# Patient Record
Sex: Female | Born: 1985
Health system: Southern US, Community
[De-identification: ages and names within clinical notes are randomized; demographics above are authoritative.]

## PROBLEM LIST (undated history)

## (undated) DIAGNOSIS — M419 Scoliosis, unspecified: Secondary | ICD-10-CM

## (undated) DIAGNOSIS — A630 Anogenital (venereal) warts: Secondary | ICD-10-CM

## (undated) DIAGNOSIS — G43009 Migraine without aura, not intractable, without status migrainosus: Secondary | ICD-10-CM

## (undated) DIAGNOSIS — F411 Generalized anxiety disorder: Secondary | ICD-10-CM

## (undated) DIAGNOSIS — Z9109 Other allergy status, other than to drugs and biological substances: Secondary | ICD-10-CM

## (undated) DIAGNOSIS — G43909 Migraine, unspecified, not intractable, without status migrainosus: Secondary | ICD-10-CM

## (undated) HISTORY — DX: Migraine without aura, not intractable, without status migrainosus: G43.009

## (undated) HISTORY — DX: Other allergy status, other than to drugs and biological substances: Z91.09

## (undated) HISTORY — DX: Anogenital (venereal) warts: A63.0

## (undated) HISTORY — DX: Migraine, unspecified, not intractable, without status migrainosus: G43.909

## (undated) HISTORY — DX: Scoliosis, unspecified: M41.9

## (undated) HISTORY — DX: Generalized anxiety disorder: F41.1

---

## 2007-01-15 HISTORY — PX: CONDYLOMA EXCISION/FULGURATION: SHX1389

## 2012-09-03 ENCOUNTER — Emergency Department (INDEPENDENT_AMBULATORY_CARE_PROVIDER_SITE_OTHER)
Admission: EM | Admit: 2012-09-03 | Discharge: 2012-09-03 | Disposition: A | Discharge status: Home / Self Care | Payer: 59 | Source: Home / Self Care | Attending: Emergency Medicine | Admitting: Emergency Medicine

## 2012-09-03 ENCOUNTER — Encounter: Payer: Self-pay | Admitting: Emergency Medicine

## 2012-09-03 DIAGNOSIS — M25532 Pain in left wrist: Secondary | ICD-10-CM

## 2012-09-03 DIAGNOSIS — M25539 Pain in unspecified wrist: Secondary | ICD-10-CM

## 2012-09-03 NOTE — ED Notes (Signed)
Left wrist pain x 4 days

## 2012-09-03 NOTE — ED Provider Notes (Signed)
  CSN: [id number]     Arrival date & time 09/03/12  0813 History     First MD Initiated Contact with Patient 09/03/12 [phone]     Chief Complaint  Patient presents with  . Wrist Pain   (Consider location/radiation/quality/duration/timing/severity/associated sxs/prior Treatment) HPI Patient with left wrist pain for last 4 days.  She states she had similar pain last year and was told that it was tendinitis and it went away on its own.  She also reports intermittent pain for the last 3-5 years.  It is located mostly on the dorsal aspect of her left wrist and can radiate up into the arm.  No known injury.  She cannot specify one located that it hurts more than others.  Not using any medications or modalities..  History reviewed. No pertinent past medical history. No past surgical history on file. No family history on file. History  Substance Use Topics  . Smoking status: Not on file  . Smokeless tobacco: Not on file  . Alcohol Use: Not on file   OB History   Grav Para Term Preterm Abortions TAB SAB Ect Mult Living                 Review of Systems  All other systems reviewed and are negative.    Allergies  Review of patient's allergies indicates no known allergies.  Home Medications  No current outpatient prescriptions on file. BP 122/77  Pulse 92  Temp(Src) 98.5 F (36.9 C) (Oral)  Ht 5\' 5"  (1.651 m)  Wt 159 lb (72.122 kg)  BMI 26.46 kg/m2  SpO2 100% Physical Exam  Nursing note and vitals reviewed. Constitutional: She is oriented to person, place, and time. She appears well-developed and well-nourished.  HENT:  Head: Normocephalic and atraumatic.  Eyes: No scleral icterus.  Neck: Neck supple.  Cardiovascular: Regular rhythm and normal heart sounds.   Pulmonary/Chest: Effort normal and breath sounds normal. No respiratory distress.  Musculoskeletal:  Examinations demonstrated full range of motion.  She has global tenderness on the dorsal aspect.  She does have some  swelling up on volar flexion of the wrist and tenderness at that location especially around the scapholunate area.  Phalen's test demonstrates pain to location and no radiculopathy.  Distal neurovascular status is intact.  Neurological: She is alert and oriented to person, place, and time.  Skin: Skin is warm and dry.  Psychiatric: She has a normal mood and affect. Her speech is normal.    ED Course   Procedures (including critical care time)  Labs Reviewed - No data to display No results found. 1. Wrist pain, left     MDM   Patient with left wrist pain.  I suspect that this may be a ganglion cyst due to the long-standing symptoms in the swelling.  I've given her a wrist splint and advised her followup in sports medicine physician next door who can then do an ultrasound of the area and perhaps either an aspiration or injection.  If not improving she may need an MRI or surgical consult at that time.  Patient understands and agrees.  Can take over-the-counter anti-inflammatories as needed    Marlaine Hind, MD 09/03/12 1000

## 2012-09-09 ENCOUNTER — Ambulatory Visit (INDEPENDENT_AMBULATORY_CARE_PROVIDER_SITE_OTHER): Payer: 59 | Admitting: Sports Medicine

## 2012-09-09 ENCOUNTER — Encounter: Payer: Self-pay | Admitting: Sports Medicine

## 2012-09-09 VITALS — BP 121/73 | HR 98 | Wt 160.0 lb

## 2012-09-09 DIAGNOSIS — M25532 Pain in left wrist: Secondary | ICD-10-CM | POA: Insufficient documentation

## 2012-09-09 DIAGNOSIS — M6740 Ganglion, unspecified site: Secondary | ICD-10-CM

## 2012-09-09 DIAGNOSIS — F329 Major depressive disorder, single episode, unspecified: Secondary | ICD-10-CM

## 2012-09-09 MED ORDER — MELOXICAM 15 MG PO TABS: ORAL_TABLET | ORAL | Status: DC

## 2012-09-09 MED ORDER — CITALOPRAM HYDROBROMIDE 20 MG PO TABS: 20.0000 mg | ORAL_TABLET | Freq: Every day | ORAL | Status: DC

## 2012-09-09 NOTE — Assessment & Plan Note (Signed)
Injection of ganglion cyst at radiocarpal joint as above. Wrist brace given. Return in one month for this. Mobic for pain.

## 2012-09-09 NOTE — Assessment & Plan Note (Signed)
Tearful in the exam room. Also extremely anxious. Starting citalopram, she'll come back to see me in 2-3 weeks.

## 2012-09-09 NOTE — Progress Notes (Signed)
   Subjective:    I'm seeing this patient as a consultation for:  Dr. Orson Aloe  CC: Left wrist pain  HPI: Left wrist pain: This is been present for years, she notes a visible and palpable mass over the dorsal radial carpal joint, worse with increased use of the left hand. Pain is localized, doesn't radiate, moderate. She was seen in urgent care, was diagnosed with ganglion cyst, and referred to me for further evaluation and definitive treatment.  Anxiety/depression: During the entire visit Brittney Estrada was tearful, she is extremely worried, and unfounded nature about her wrist pain, she also endorses depressed mood, poor concentration, poor interest, feelings of guilt, poor appetite, poor sleep, but denies any suicidal or homicidal ideation. She has not been on SSRI in the past, she doesn't get worse that she was on valproic acid in the distant past with inadequate response. She has no primary care provider.  Past medical history, Surgical history, Family history not pertinant except as noted below, Social history, Allergies, and medications have been entered into the medical record, reviewed, and no changes needed.   Review of Systems: No headache, visual changes, nausea, vomiting, diarrhea, constipation, dizziness, abdominal pain, skin rash, fevers, chills, night sweats, weight loss, swollen lymph nodes, body aches, joint swelling, muscle aches, chest pain, shortness of breath, mood changes, visual or auditory hallucinations.   Objective:   General: Well Developed, well nourished, tearful. Neuro/Psych: Alert and oriented x3, extra-ocular muscles intact, able to move all 4 extremities, sensation grossly intact. Skin: Warm and dry, no rashes noted.  Respiratory: Not using accessory muscles, speaking in full sentences, trachea midline.  Cardiovascular: Pulses palpable, no extremity edema. Abdomen: Does not appear distended. Left Wrist: There is a visible and palpable fullness over the dorsal  radiocarpal joint suggestive of a ganglionic cyst. ROM smooth and normal with good flexion and extension and ulnar/radial deviation that is symmetrical with opposite wrist. Palpation is normal over metacarpals, navicular, lunate, and TFCC; tendons without tenderness/ swelling No snuffbox tenderness. No tenderness over Canal of Guyon. Strength 5/5 in all directions without pain. Negative Finkelstein, tinel's and phalens. Negative Watson's test.  Procedure: Real-time Ultrasound Guided Injection of left dorsal radiocarpal joint and ganglionic cyst Device: GE Logiq E  Verbal informed consent obtained.  Time-out conducted.  Noted no overlying erythema, induration, or other signs of local infection.  Skin prepped in a sterile fashion.  Local anesthesia: Topical Ethyl chloride.  With sterile technique and under real time ultrasound guidance:  25-gauge needle advanced into the cyst, this was located just superficial to the radio lunate joint, 0.5 cc Kenalog 40, 1 cc lidocaine injected easily. Completed without difficulty  Pain immediately resolved suggesting accurate placement of the medication.  Advised to call if fevers/chills, erythema, induration, drainage, or persistent bleeding.  Images permanently stored and available for review in the ultrasound unit.  Impression: Technically successful ultrasound guided injection.  Impression and Recommendations:   This case required medical decision making of moderate complexity.

## 2012-09-16 ENCOUNTER — Encounter: Payer: Self-pay | Admitting: Sports Medicine

## 2012-09-30 ENCOUNTER — Encounter: Payer: Self-pay | Admitting: Sports Medicine

## 2012-09-30 ENCOUNTER — Ambulatory Visit (INDEPENDENT_AMBULATORY_CARE_PROVIDER_SITE_OTHER): Payer: 59

## 2012-09-30 ENCOUNTER — Ambulatory Visit: Payer: 59 | Admitting: Family Medicine

## 2012-09-30 ENCOUNTER — Ambulatory Visit (INDEPENDENT_AMBULATORY_CARE_PROVIDER_SITE_OTHER): Payer: 59 | Admitting: Sports Medicine

## 2012-09-30 VITALS — BP 111/77 | HR 77 | Wt 157.0 lb

## 2012-09-30 DIAGNOSIS — M25532 Pain in left wrist: Secondary | ICD-10-CM

## 2012-09-30 DIAGNOSIS — M25539 Pain in unspecified wrist: Secondary | ICD-10-CM

## 2012-09-30 DIAGNOSIS — M542 Cervicalgia: Secondary | ICD-10-CM

## 2012-09-30 DIAGNOSIS — F329 Major depressive disorder, single episode, unspecified: Secondary | ICD-10-CM

## 2012-09-30 DIAGNOSIS — M7918 Myalgia, other site: Secondary | ICD-10-CM | POA: Insufficient documentation

## 2012-09-30 NOTE — Assessment & Plan Note (Signed)
Stop citalopram, she did feel significantly better when switching from third to second shift. We will keep an eye on this. She does have some myofascial symptoms, we could certainly consider amitriptyline, gabapentin, Lyrica, or Cymbalta in the future.
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
## 2012-09-30 NOTE — Assessment & Plan Note (Signed)
Resolved after injection of ganglion cyst under ultrasound guidance.

## 2012-11-10 ENCOUNTER — Ambulatory Visit (INDEPENDENT_AMBULATORY_CARE_PROVIDER_SITE_OTHER): Payer: 59 | Admitting: Sports Medicine

## 2012-11-10 ENCOUNTER — Encounter: Payer: Self-pay | Admitting: Sports Medicine

## 2012-11-10 VITALS — BP 127/80 | HR 89 | Wt 160.0 lb

## 2012-11-10 DIAGNOSIS — M25539 Pain in unspecified wrist: Secondary | ICD-10-CM

## 2012-11-10 DIAGNOSIS — M25532 Pain in left wrist: Secondary | ICD-10-CM

## 2012-11-10 DIAGNOSIS — Z23 Encounter for immunization: Secondary | ICD-10-CM

## 2012-11-10 DIAGNOSIS — Z299 Encounter for prophylactic measures, unspecified: Secondary | ICD-10-CM | POA: Insufficient documentation

## 2012-11-10 DIAGNOSIS — M7918 Myalgia, other site: Secondary | ICD-10-CM

## 2012-11-10 DIAGNOSIS — IMO0001 Reserved for inherently not codable concepts without codable children: Secondary | ICD-10-CM

## 2012-11-10 LAB — HEPATITIS B SURFACE ANTIBODY, QUANTITATIVE: Hep B S AB Quant (Post): 0 m[IU]/mL

## 2012-11-10 NOTE — Assessment & Plan Note (Signed)
Continues to be resolved after injection of ganglion cyst.

## 2012-11-10 NOTE — Assessment & Plan Note (Signed)
Certainly this is in the spectrum of fibromyalgia. She is hypersensitive multiple areas over her trapezius, neck, and back. We did discuss nerve blocking medication such as gabapentin, amitriptyline, Cymbalta. She would like to thank about this further before starting the medication, I will have her do some home exercises, and print her out some information. Cervical spine x-rays were negative.

## 2012-11-10 NOTE — Patient Instructions (Signed)
Myofascial Pain Syndrome Myofascial pain syndrome is a pain disorder. This pain may be felt in the muscles. It may come and go. Myofascial pain syndrome always has trigger or tender points in the muscle that will cause pain when pressed.  CAUSES Myofascial pain may be caused by injuries, especially auto accidents, or by overuse of certain muscles. Typically the pain is long lasting. It is made worse by overuse of the involved muscles, emotional distress, and by cold, damp weather. Myofascial pain syndrome often develops in patients whose response to stress is an increase in muscle tone, and is seen in greater frequency in patients with pre-existing tension headaches. SYMPTOMS  Myofascial pain syndrome causes a wide variety of symptoms. You may see tight ropy bands of muscle. Problems may also include aching, cramping, burning, numbness, tingling, and other uncomfortable sensations in muscular areas. It most commonly affects the neck, upper back, and shoulder areas. Pain often radiates into the arms and hands.  TREATMENT Treatment includes resting the affected muscular area and applying ice packs to reduce spasm and pain.  Certain medications that block excess conduction through nerves are extremely effective. Trigger point injection, is a valuable initial therapy as well. This therapy is an injection of local anesthetic directly into the trigger point. Trigger points are often present at the source of pain. Pain relief following injection confirms the diagnosis of myofascial pain syndrome. Fairly vigorous therapy can be carried out during the pain-free period after each injection. Stretching exercises to loosen up the muscles are also useful. Transcutaneous electrical nerve stimulation (TENS) may provide relief from pain. TENS is the use of electric current produced by a device to stimulate the nerves. Ultrasound therapy applied directly over the affected muscle may also provide pain relief. Anti-inflammatory  pain medicine can be helpful. Symptoms will gradually improve over a period of weeks to months with proper treatment. Symptoms however can also persist long-term. This condition is very manageable. HOME CARE INSTRUCTIONS Call your caregiver for follow-up care as recommended.  SEEK MEDICAL CARE IF:  Your pain is severe and not helped with medications. Document Released: 02/08/2004 Document Revised: 03/25/2011 Document Reviewed: 02/16/2010 [facility] Patient Information 2014 [place], [place].

## 2012-11-10 NOTE — Progress Notes (Signed)
  Subjective:    CC: Followup  HPI: Mood disorder: Resolved with switching to second shift.  Wrist pain: Completely resolved after ultrasound guided injection of a ganglion cyst, unfortunately she tells me that her insurance coverage did not begin until the next day, and wonders if we can switch date of service, I advised that this would not be possible. She can work with [employer] to set up a payment plan.  Neck pain: Present chronically, multiple painful locations, x-rays were negative in the recent past, and she is still not entirely certain that she wants me to treat it, but does desire to talk about it.  Past medical history, Surgical history, Family history not pertinant except as noted below, Social history, Allergies, and medications have been entered into the medical record, reviewed, and no changes needed.   Review of Systems: No fevers, chills, night sweats, weight loss, chest pain, or shortness of breath.   Objective:    General: Well Developed, well nourished, and in no acute distress.  Neuro: Alert and oriented x3, extra-ocular muscles intact, sensation grossly intact.  HEENT: Normocephalic, atraumatic, pupils equal round reactive to light, neck supple, no masses, no lymphadenopathy, thyroid nonpalpable.  Skin: Warm and dry, no rashes. Cardiac: Regular rate and rhythm, no murmurs rubs or gallops, no lower extremity edema.  Respiratory: Clear to auscultation bilaterally. Not using accessory muscles, speaking in full sentences. Neck: Inspection unremarkable. No palpable stepoffs. Negative Spurling's maneuver. Full neck range of motion Multiple areas of discrete tenderness to palpation, she is somewhat hyperesthetic with definite signs of symptom exaggeration. Grip strength and sensation normal in bilateral hands Strength good C4 to T1 distribution No sensory change to C4 to T1 Negative Hoffman sign bilaterally Reflexes normal  Cervical spine x-rays were reviewed and are  completely normal.  Impression and Recommendations:

## 2012-11-10 NOTE — Assessment & Plan Note (Signed)
Up-to-date on flu, cervical cancer screening. Tdap given today. Antibody titers. Needs hepatitis B

## 2012-11-30 ENCOUNTER — Ambulatory Visit: Payer: 59 | Admitting: Sports Medicine

## 2012-12-08 ENCOUNTER — Ambulatory Visit: Payer: 59 | Admitting: Sports Medicine

## 2014-04-19 ENCOUNTER — Emergency Department
Admission: EM | Admit: 2014-04-19 | Discharge: 2014-04-19 | Disposition: A | Discharge status: Home / Self Care | Payer: 59 | Source: Home / Self Care | Attending: Emergency Medicine | Admitting: Emergency Medicine

## 2014-04-19 ENCOUNTER — Encounter: Payer: Self-pay | Admitting: *Deleted

## 2014-04-19 DIAGNOSIS — J029 Acute pharyngitis, unspecified: Secondary | ICD-10-CM

## 2014-04-19 LAB — POCT RAPID STREP A (OFFICE): Rapid Strep A Screen: NEGATIVE

## 2014-04-19 MED ORDER — FLUCONAZOLE 150 MG PO TABS: 150.0000 mg | ORAL_TABLET | Freq: Every day | ORAL | Status: DC

## 2014-04-19 MED ORDER — AMOXICILLIN 500 MG PO CAPS: 500.0000 mg | ORAL_CAPSULE | Freq: Three times a day (TID) | ORAL | Status: DC

## 2014-04-19 NOTE — Discharge Instructions (Signed)

## 2014-04-19 NOTE — ED Notes (Signed)
Pt c/o sore throat x 1 wk, worse x 1 day. She reports recent strep exposure.

## 2014-04-19 NOTE — ED Provider Notes (Signed)
CSN: [id number]     Arrival date & time 04/19/14  1425 History   First MD Initiated Contact with Patient 04/19/14 1506     Chief Complaint  Patient presents with  . Sore Throat   (Consider location/radiation/quality/duration/timing/severity/associated sxs/prior Treatment) Patient is a 29 y.o. female presenting with pharyngitis. The history is provided by the patient. No language interpreter was used.  Sore Throat This is a new problem. The problem occurs constantly. The problem has been gradually worsening. Pertinent negatives include no headaches. Nothing aggravates the symptoms. Nothing relieves the symptoms. She has tried nothing for the symptoms. The treatment provided no relief.    Past Medical History  Diagnosis Date  . Condyloma   . Scoliosis    Past Surgical History  Procedure Laterality Date  . Condyloma excision/fulguration  2009   Family History  Problem Relation Age of Onset  . Alcohol abuse Mother   . Cancer Mother   . Depression Mother   . Hypertension Mother   . Cancer Maternal Aunt   . Diabetes Maternal Grandmother    History  Substance Use Topics  . Smoking status: Light Tobacco Smoker    Types: Cigarettes  . Smokeless tobacco: Not on file  . Alcohol Use: Yes   OB History    No data available     Review of Systems  HENT: Positive for sore throat.   Neurological: Negative for headaches.  All other systems reviewed and are negative.   Allergies  Latex and Other  Home Medications   Prior to Admission medications   Medication Sig Start Date End Date Taking? Authorizing Provider  amoxicillin (AMOXIL) 500 MG capsule Take 1 capsule (500 mg total) by mouth 3 (three) times daily. 04/19/14   Elson AreasLeslie K Gianne Shugars, PA-C  fluconazole (DIFLUCAN) 150 MG tablet Take 1 tablet (150 mg total) by mouth daily. 04/19/14   Elson AreasLeslie K Dilraj Killgore, PA-C  meloxicam (MOBIC) 15 MG tablet One tab PO qAM with breakfast for 2 weeks, then daily prn pain. 09/09/12   Monica Bectonhomas J Thekkekandam, MD    BP 120/78 mmHg  Pulse 76  Temp(Src) 98.7 F (37.1 C) (Oral)  Resp 16  Ht 5\' 5"  (1.651 m)  Wt 158 lb (71.668 kg)  BMI 26.29 kg/m2  SpO2 99%  LMP 04/03/2014 Physical Exam  Constitutional: She is oriented to person, place, and time. She appears well-developed and well-nourished.  HENT:  Head: Normocephalic.  Right Ear: External ear normal.  Left Ear: External ear normal.  Nose: Nose normal.  Eyes: Conjunctivae and EOM are normal. Pupils are equal, round, and reactive to light.  Neck: Normal range of motion.  Cardiovascular: Normal rate and normal heart sounds.   Pulmonary/Chest: Effort normal and breath sounds normal.  Abdominal: Soft. She exhibits no distension.  Musculoskeletal: Normal range of motion.  Neurological: She is alert and oriented to person, place, and time.  Skin: Skin is warm.  Psychiatric: She has a normal mood and affect.  Nursing note and vitals reviewed.   ED Course  Procedures (including critical care time) Labs Review Labs Reviewed  POCT RAPID STREP A (OFFICE)   Strep is negative Pt exposed to strep,  I will treat and culture Imaging Review No results found.   MDM   1. Acute pharyngitis, unspecified pharyngitis type    Amoxicilian Diflucan See Dr. Karie Schwalbet for recheck if symptoms persist    Elson AreasLeslie K Tamya Denardo, [place] JerseyPA-C 04/19/14 1557

## 2014-04-24 ENCOUNTER — Telehealth: Payer: Self-pay | Admitting: Emergency Medicine

## 2015-05-05 ENCOUNTER — Encounter: Payer: Self-pay | Admitting: Sports Medicine

## 2015-05-05 ENCOUNTER — Ambulatory Visit (INDEPENDENT_AMBULATORY_CARE_PROVIDER_SITE_OTHER): Payer: Managed Care, Other (non HMO) | Admitting: Sports Medicine

## 2015-05-05 VITALS — BP 133/89 | HR 87 | Resp 18 | Wt 166.2 lb

## 2015-05-05 DIAGNOSIS — Z91018 Allergy to other foods: Secondary | ICD-10-CM

## 2015-05-05 DIAGNOSIS — G43909 Migraine, unspecified, not intractable, without status migrainosus: Secondary | ICD-10-CM

## 2015-05-05 DIAGNOSIS — F411 Generalized anxiety disorder: Secondary | ICD-10-CM

## 2015-05-05 DIAGNOSIS — G43009 Migraine without aura, not intractable, without status migrainosus: Secondary | ICD-10-CM

## 2015-05-05 HISTORY — DX: Migraine, unspecified, not intractable, without status migrainosus: G43.909

## 2015-05-05 HISTORY — DX: Generalized anxiety disorder: F41.1

## 2015-05-05 MED ORDER — RIZATRIPTAN BENZOATE 10 MG PO TBDP: 10.0000 mg | ORAL_TABLET | ORAL | Status: DC | PRN

## 2015-05-05 MED ORDER — TOPIRAMATE 50 MG PO TABS: ORAL_TABLET | ORAL | Status: DC

## 2015-05-05 NOTE — Assessment & Plan Note (Signed)
Currently doing counseling which is ineffective, tearful in the exam room. However resistant to any form of pharmacologic treatment. She is currently precontemplative, no suicidal or homicidal ideation,  We will approach this at a future date.

## 2015-05-05 NOTE — Progress Notes (Signed)
  Subjective:    CC: headaches, anxiety  HPI: This is a pleasant 30 year old female, all of her life she's had migraine headaches but is never been a preventative agent and is never taken an abortive agent. Unfortunately she is debilitated for a couple of days when she gets her migraines. Agreeable to start with preventative measures as well as abortive medication.  Allergies: Has a strong reaction with likely triggering of a migraine by one of her coworkers perfumes, in addition she notes occasional numbness in her face when eating peanuts, she is requesting skin prick allergy testing.  Mood disorder: Patient was tearful in the exam room, and tells me she's having a hard time dealing with the stress work, however her mood is not that bad, she does endorse mild difficulty sleeping, poor energy, guilt, no suicidal or homicidal ideation, she does however have severe nervousness, difficulty controlling her worry, wearing about different things, difficulty relaxing, irritability and fear of impending doom. Unfortunately she is hesitant to consider pharmacologic intervention, she does have a fear that she will have an adverse effect to her medication.  Past medical history, Surgical history, Family history not pertinant except as noted below, Social history, Allergies, and medications have been entered into the medical record, reviewed, and no changes needed.   Review of Systems: No fevers, chills, night sweats, weight loss, chest pain, or shortness of breath.   Objective:    General: Well Developed, well nourished, and in no acute distress.  Neuro: Alert and oriented x3, extra-ocular muscles intact, sensation grossly intact. Cranial nerves II through XII are intact, motor, sensory, and coordinative functions are all intact. HEENT: Normocephalic, atraumatic, pupils equal round reactive to light, neck supple, no masses, no lymphadenopathy, thyroid nonpalpable.  Skin: Warm and dry, no rashes. Cardiac:  Regular rate and rhythm, no murmurs rubs or gallops, no lower extremity edema.  Respiratory: Clear to auscultation bilaterally. Not using accessory muscles, speaking in full sentences.  Impression and Recommendations:    I spent 40 minutes with this patient, greater than 50% was face-to-face time counseling regarding the above diagnoses

## 2015-05-05 NOTE — Assessment & Plan Note (Signed)
Patient desires referral for prick skin testing. Allergy referral placed.

## 2015-05-05 NOTE — Assessment & Plan Note (Signed)
Classic migraines. Unfortunately and controlled depression is going to make it difficult to treat her headaches. Adding Maxalt for use during migraines, and a bit of Topamax to use to prevent.

## 2015-05-16 ENCOUNTER — Ambulatory Visit (INDEPENDENT_AMBULATORY_CARE_PROVIDER_SITE_OTHER): Payer: Managed Care, Other (non HMO) | Admitting: Allergy and Immunology

## 2015-05-16 ENCOUNTER — Encounter: Payer: Self-pay | Admitting: Allergy and Immunology

## 2015-05-16 VITALS — BP 118/82 | HR 84 | Temp 98.4°F | Resp 16 | Ht 64.37 in | Wt 166.4 lb

## 2015-05-16 DIAGNOSIS — Z91018 Allergy to other foods: Secondary | ICD-10-CM | POA: Diagnosis not present

## 2015-05-16 DIAGNOSIS — R0600 Dyspnea, unspecified: Secondary | ICD-10-CM | POA: Diagnosis not present

## 2015-05-16 DIAGNOSIS — J3089 Other allergic rhinitis: Secondary | ICD-10-CM | POA: Diagnosis not present

## 2015-05-16 MED ORDER — LEVOCETIRIZINE DIHYDROCHLORIDE 5 MG PO TABS: 5.0000 mg | ORAL_TABLET | Freq: Every evening | ORAL | Status: DC

## 2015-05-16 MED ORDER — EPINEPHRINE 0.3 MG/0.3ML IJ SOAJ: INTRAMUSCULAR | Status: DC

## 2015-05-16 MED ORDER — FLUTICASONE PROPIONATE 50 MCG/ACT NA SUSP: 2.0000 | Freq: Every day | NASAL | Status: DC

## 2015-05-16 MED ORDER — ALBUTEROL SULFATE 108 (90 BASE) MCG/ACT IN AEPB: 2.0000 | INHALATION_SPRAY | RESPIRATORY_TRACT | Status: DC | PRN

## 2015-05-16 NOTE — Patient Instructions (Addendum)
Food allergy vs ideosyncratic reaction Skin tests to select food allergens were negative today. The negative predictive value of food allergen skin testing is excellent (approximately 95%). However, we do not have commercial extracts for the inumeralble food additives.  Should significant symptoms recur or new symptoms occur, a journal is to be kept recording any foods eaten, beverages consumed, medications taken, activities performed, and environmental conditions within a 6 hour time period prior to the onset of symptoms. For any symptoms concerning for anaphylaxis, epinephrine is to be administered and 911 is to be called immediately.   A prescription has been provided for EpiPen 0.3 mg 2 pack along with instructions for its proper administration.  A food allergy action plan has been provided and discussed.  Perennial and seasonal allergic rhinitis with a vasomotor component  Aeroallergen avoidance measures have been discussed and provided in written form.  A prescription has been provided for levocetirizine, 5 mg daily as needed.  A prescription has been provided for fluticasone nasal spray, 2 sprays per nostril daily as needed. Proper nasal spray technique has been discussed and demonstrated.  I have also recommended nasal saline spray (i.e., Simply Saline) or nasal saline lavage (i.e., NeilMed) as needed prior to medicated nasal sprays.  Dyspnea The patient's history suggests asthma, however spirometry results today do not meet ATS criteria for that diagnosis.  A prescription has been provided for ProAir Respiclick, 1-2 inhalations every 4-6 hours as needed.  Subjective and objective measures of pulmonary function will be followed and the treatment plan will be adjusted accordingly.   Return in about 4 months (around 09/16/2015), or if symptoms worsen or fail to improve.  Reducing Pollen Exposure  The American Academy of Allergy, Asthma and Immunology suggests the following steps to  reduce your exposure to pollen during allergy seasons.    1. Do not hang sheets or clothing out to dry; pollen may collect on these items. 2. Do not mow lawns or spend time around freshly cut grass; mowing stirs up pollen. 3. Keep windows closed at night.  Keep car windows closed while driving. 4. Minimize morning activities outdoors, a time when pollen counts are usually at their highest. 5. Stay indoors as much as possible when pollen counts or humidity is high and on windy days when pollen tends to remain in the air longer. 6. Use air conditioning when possible.  Many air conditioners have filters that trap the pollen spores. 7. Use a HEPA room air filter to remove pollen form the indoor air you breathe.   Control of Mold Allergen  Mold and fungi can grow on a variety of surfaces provided certain temperature and moisture conditions exist.  Outdoor molds grow on plants, decaying vegetation and soil.  The major outdoor mold, Alternaria and Cladosporium, are found in very high numbers during hot and dry conditions.  Generally, a late Summer - Fall peak is seen for common outdoor fungal spores.  Rain will temporarily lower outdoor mold spore count, but counts rise rapidly when the rainy period ends.  The most important indoor molds are Aspergillus and Penicillium.  Dark, humid and poorly ventilated basements are ideal sites for mold growth.  The next most common sites of mold growth are the bathroom and the kitchen.  Outdoor [employer] 1. Use air conditioning and keep windows closed 2. Avoid exposure to decaying vegetation. 3. Avoid leaf raking. 4. Avoid grain handling. 5. Consider wearing a face mask if working in moldy areas.  Indoor Mold Control 1.  Maintain humidity below 50%. 2. Clean washable surfaces with 5% bleach solution. 3. Remove sources e.g. Contaminated carpets.  Control of Dog or Cat Allergen  Avoidance is the best way to manage a dog or cat allergy. If you have a dog or  cat and are allergic to dog or cats, consider removing the dog or cat from the home. If you have a dog or cat but don't want to find it a new home, or if your family wants a pet even though someone in the household is allergic, here are some strategies that may help keep symptoms at bay:  1. Keep the pet out of your bedroom and restrict it to only a few rooms. Be advised that keeping the dog or cat in only one room will not limit the allergens to that room. 2. Don't pet, hug or kiss the dog or cat; if you do, wash your hands with soap and water. 3. High-efficiency particulate air (HEPA) cleaners run continuously in a bedroom or living room can reduce allergen levels over time. 4. Regular use of a high-efficiency vacuum cleaner or a central vacuum can reduce allergen levels. 5. Giving your dog or cat a bath at least once a week can reduce airborne allergen.

## 2015-05-16 NOTE — Assessment & Plan Note (Signed)
   Aeroallergen avoidance measures have been discussed and provided in written form.  A prescription has been provided for levocetirizine, 5mg daily as needed.   A prescription has been provided for fluticasone nasal spray, 2 sprays per nostril daily as needed. Proper nasal spray technique has been discussed and demonstrated.  I have also recommended nasal saline spray (i.e., Simply Saline) or nasal saline lavage (i.e., NeilMed) as needed prior to medicated nasal sprays. 

## 2015-05-16 NOTE — Assessment & Plan Note (Addendum)
Skin tests to select food allergens were negative today. The negative predictive value of food allergen skin testing is excellent (approximately 95%). However, we do not have commercial extracts for the inumeralble food additives.  Should significant symptoms recur or new symptoms occur, a journal is to be kept recording any foods eaten, beverages consumed, medications taken, activities performed, and environmental conditions within a 6 hour time period prior to the onset of symptoms. For any symptoms concerning for anaphylaxis, epinephrine is to be administered and 911 is to be called immediately.   A prescription has been provided for EpiPen 0.3 mg 2 pack along with instructions for its proper administration.  A food allergy action plan has been provided and discussed.

## 2015-05-16 NOTE — Progress Notes (Signed)
New Patient Note  RE: Brittney Estrada MRN: [id number] DOB: 09-02-1985 Date of Office Visit: 05/16/2015  Referring provider: Monica Becton Primary care provider: Rodney Langton, MD  Chief Complaint: Nasal Congestion; Food Intolerance; and Breathing Problem   History of present illness: HPI Comments: Brittney Estrada is a 30 y.o. female presenting today for consultation of possible food allergies and environmental allergies.  Over the past 2 months, she has noted a "tingly, itchy, numb" sensation on her face after she eats peanut butter crackers, vanilla crackers, or Goldfish crackers.  She denies visible rash or concomitant cardiopulmonary or GI symptoms.  She does not develop symptoms after eating peanuts or peanut butter.  She has not experienced symptoms since having discontinued these crackers.  She also complains of nasal congestion, rhinorrhea, postnasal drainage, as well as dyspnea and the sensation of chest tightness.  The symptoms occur year around without significant seasonal variation and are triggered by strong aromas such as perfumes, colognes, and detergents.  Her lower respiratory symptoms are also triggered by respiratory tract infections.    Assessment and plan: Food allergy vs ideosyncratic reaction Skin tests to select food allergens were negative today. The negative predictive value of food allergen skin testing is excellent (approximately 95%). However, we do not have commercial extracts for the inumeralble food additives.  Should significant symptoms recur or new symptoms occur, a journal is to be kept recording any foods eaten, beverages consumed, medications taken, activities performed, and environmental conditions within a 6 hour time period prior to the onset of symptoms. For any symptoms concerning for anaphylaxis, epinephrine is to be administered and 911 is to be called immediately.   A prescription has been provided for EpiPen 0.3 mg 2  pack along with instructions for its proper administration.  A food allergy action plan has been provided and discussed.  Perennial and seasonal allergic rhinitis with a vasomotor component  Aeroallergen avoidance measures have been discussed and provided in written form.  A prescription has been provided for levocetirizine, 5 mg daily as needed.  A prescription has been provided for fluticasone nasal spray, 2 sprays per nostril daily as needed. Proper nasal spray technique has been discussed and demonstrated.  I have also recommended nasal saline spray (i.e., Simply Saline) or nasal saline lavage (i.e., NeilMed) as needed prior to medicated nasal sprays.  Dyspnea The patient's history suggests asthma, however spirometry results today do not meet ATS criteria for that diagnosis.  A prescription has been provided for ProAir Respiclick, 1-2 inhalations every 4-6 hours as needed.  Subjective and objective measures of pulmonary function will be followed and the treatment plan will be adjusted accordingly.    Meds ordered this encounter  Medications  . Albuterol Sulfate (PROAIR RESPICLICK) 108 (90 Base) MCG/ACT AEPB    Sig: Inhale 2 puffs into the lungs every 4 (four) hours as needed.    Dispense:  1 each    Refill:  1  . EPINEPHrine 0.3 mg/0.3 mL IJ SOAJ injection    Sig: Use as directed for severe allergic reaction    Dispense:  2 Device    Refill:  1  . levocetirizine (XYZAL) 5 MG tablet    Sig: Take 1 tablet (5 mg total) by mouth every evening.    Dispense:  30 tablet    Refill:  5  . fluticasone (FLONASE) 50 MCG/ACT nasal spray    Sig: Place 2 sprays into both nostrils daily.    Dispense:  16 g  Refill:  5    Diagnositics: Spirometry: Normal with an FEV1 of 117% predicted.  Please see scanned spirometry results for details. Environmental skin testing: Positive to grass pollen, molds, and dog epithelia. Food allergen skin testing: Negative despite a positive histamine  control.    Physical examination: Blood pressure 118/82, pulse 84, temperature 98.4 F (36.9 C), temperature source Oral, resp. rate 16, height 5' 4.37" (1.635 m), weight 166 lb 7.2 oz (75.5 kg).  General: Alert, interactive, in no acute distress. HEENT: TMs pearly gray, turbinates edematous without discharge, post-pharynx erythematous. Neck: Supple without lymphadenopathy. Lungs: Clear to auscultation without wheezing, rhonchi or rales. CV: Normal S1, S2 without murmurs. Abdomen: Nondistended, nontender. Skin: Warm and dry, without lesions or rashes. Extremities:  No clubbing, cyanosis or edema. Neuro:   Grossly intact.  Review of systems:  Review of Systems  Constitutional: Negative for fever, chills and weight loss.  HENT: Positive for congestion. Negative for nosebleeds.   Eyes: Negative for blurred vision.  Respiratory: Positive for shortness of breath. Negative for hemoptysis.   Cardiovascular: Negative for chest pain.  Gastrointestinal: Negative for diarrhea and constipation.  Genitourinary: Negative for dysuria.  Musculoskeletal: Negative for myalgias and joint pain.  Skin: Positive for itching.  Neurological: Positive for headaches. Negative for dizziness.  Endo/Heme/Allergies: Positive for environmental allergies. Does not bruise/bleed easily.    Past medical history:  Past Medical History  Diagnosis Date  . Condyloma   . Scoliosis     Past surgical history:  Past Surgical History  Procedure Laterality Date  . Condyloma excision/fulguration  2009    Family history: Family History  Problem Relation Age of Onset  . Alcohol abuse Mother   . Cancer Mother   . Depression Mother   . Hypertension Mother   . Cancer Maternal Aunt   . Diabetes Maternal Grandmother   . Allergic rhinitis Neg Hx   . Angioedema Neg Hx   . Asthma Neg Hx   . Atopy Neg Hx   . Eczema Neg Hx   . Immunodeficiency Neg Hx   . Urticaria Neg Hx     Social history: Social History    Social History  . Marital Status: Married    Spouse Name: N/A  . Number of Children: N/A  . Years of Education: N/A   Occupational History  . Not on file.   Social History Main Topics  . Smoking status: Former Smoker    Types: Cigarettes    Quit date: 01/14/2014  . Smokeless tobacco: Not on file  . Alcohol Use: 0.0 oz/week    0 Standard drinks or equivalent per week  . Drug Use: Not on file  . Sexual Activity: Yes    Birth Control/ Protection: Condom   Other Topics Concern  . Not on file   Social History Narrative   Environmental History: The patient lives in a 30 year old house with hardwood floors throughout and central air/heat.  She is a nonsmoker without pets.    Medication List       This list is accurate as of: 05/16/15  6:16 PM.  Always use your most recent med list.               Albuterol Sulfate 108 (90 Base) MCG/ACT Aepb  Commonly known as:  PROAIR RESPICLICK  Inhale 2 puffs into the lungs every 4 (four) hours as needed.     EPINEPHrine 0.3 mg/0.3 mL Soaj injection  Commonly known as:  EPI-PEN  Use as directed for  severe allergic reaction     fluticasone 50 MCG/ACT nasal spray  Commonly known as:  FLONASE  Place 2 sprays into both nostrils daily.     levocetirizine 5 MG tablet  Commonly known as:  XYZAL  Take 1 tablet (5 mg total) by mouth every evening.     rizatriptan 10 MG disintegrating tablet  Commonly known as:  MAXALT-MLT  Take 1 tablet (10 mg total) by mouth as needed for migraine. May repeat in 2 hours if needed     topiramate 50 MG tablet  Commonly known as:  TOPAMAX  One half tab by mouth  At bedtime for one week then one tab by mouth  At bedtime.        Known medication allergies: Allergies  Allergen Reactions  . Latex   . Other     mint    I appreciate the opportunity to take part in this [facility] care. Please do not hesitate to contact me with questions.  Sincerely,   R. Jorene Guest, MD

## 2015-05-16 NOTE — Assessment & Plan Note (Addendum)
The patient's history suggests asthma, however spirometry results today do not meet ATS criteria for that diagnosis.  A prescription has been provided for ProAir Respiclick, 1-2 inhalations every 4-6 hours as needed.  Subjective and objective measures of pulmonary function will be followed and the treatment plan will be adjusted accordingly.

## 2015-05-17 ENCOUNTER — Telehealth: Payer: Self-pay | Admitting: Allergy and Immunology

## 2015-05-17 NOTE — Telephone Encounter (Signed)
Called pt and left message about the letter, to see if she would pick it up or have us mail it. She called this morning and wanted us to mail it. Put it in the mail this morning.

## 2015-05-22 ENCOUNTER — Encounter: Payer: Self-pay | Admitting: Obstetrics and Gynecology

## 2015-05-22 ENCOUNTER — Ambulatory Visit (INDEPENDENT_AMBULATORY_CARE_PROVIDER_SITE_OTHER): Payer: Managed Care, Other (non HMO) | Admitting: Obstetrics and Gynecology

## 2015-05-22 VITALS — BP 110/70 | HR 80 | Resp 15 | Ht 64.25 in | Wt 169.0 lb

## 2015-05-22 DIAGNOSIS — Z01419 Encounter for gynecological examination (general) (routine) without abnormal findings: Secondary | ICD-10-CM | POA: Diagnosis not present

## 2015-05-22 DIAGNOSIS — N946 Dysmenorrhea, unspecified: Secondary | ICD-10-CM | POA: Diagnosis not present

## 2015-05-22 DIAGNOSIS — Z124 Encounter for screening for malignant neoplasm of cervix: Secondary | ICD-10-CM | POA: Diagnosis not present

## 2015-05-22 MED ORDER — NAPROXEN SODIUM 550 MG PO TABS: ORAL_TABLET | ORAL | Status: DC

## 2015-05-22 NOTE — Patient Instructions (Addendum)
EXERCISE AND DIET:  We recommended that you start or continue a regular exercise program for good health. Regular exercise means any activity that makes your heart beat faster and makes you sweat.  We recommend exercising at least 30 minutes per day at least 3 days a week, preferably 4 or 5.  We also recommend a diet low in fat and sugar.  Inactivity, poor dietary choices and obesity can cause diabetes, heart attack, stroke, and kidney damage, among others.    ALCOHOL AND SMOKING:  Women should limit their alcohol intake to no more than 7 drinks/beers/glasses of wine (combined, not each!) per week. Moderation of alcohol intake to this level decreases your risk of breast cancer and liver damage. And of course, no recreational drugs are part of a healthy lifestyle.  And absolutely no smoking or even second hand smoke. Most people know smoking can cause heart and lung diseases, but did you know it also contributes to weakening of your bones? Aging of your skin?  Yellowing of your teeth and nails?  CALCIUM AND VITAMIN D:  Adequate intake of calcium and Vitamin D are recommended.  The recommendations for exact amounts of these supplements seem to change often, but generally speaking 600 mg of calcium (either carbonate or citrate) and 800 units of Vitamin D per day seems prudent. Certain women may benefit from higher intake of Vitamin D.  If you are among these women, your doctor will have told you during your visit.    PAP SMEARS:  Pap smears, to check for cervical cancer or precancers,  have traditionally been done yearly, although recent scientific advances have shown that most women can have pap smears less often.  However, every woman still should have a physical exam from her gynecologist every year. It will include a breast check, inspection of the vulva and vagina to check for abnormal growths or skin changes, a visual exam of the cervix, and then an exam to evaluate the size and shape of the uterus and  ovaries.  And after 30 years of age, a rectal exam is indicated to check for rectal cancers. We will also provide age appropriate advice regarding health maintenance, like when you should have certain vaccines, screening for sexually transmitted diseases, bone density testing, colonoscopy, mammograms, etc.   MAMMOGRAMS:  All women over 30 years old should have a yearly mammogram. Many facilities now offer a "3D" mammogram, which may cost around $50 extra out of pocket. If possible,  we recommend you accept the option to have the 3D mammogram performed.  It both reduces the number of women who will be called back for extra views which then turn out to be normal, and it is better than the routine mammogram at detecting truly abnormal areas.    Genital Warts Genital warts are a common STD (sexually transmitted disease). They may appear as small bumps on the tissues of the genital area or anal area. Sometimes, they can become irritated and cause pain. Genital warts are easily passed to other people through sexual contact. Getting treatment is important because genital warts can lead to other problems. In females, the virus that causes genital warts may increase the risk of cervical cancer. CAUSES Genital warts are caused by a virus that is called human papillomavirus (HPV). HPV is spread by having unprotected sex with an infected person. It can be spread through vaginal, anal, and oral sex. Many people do not know that they are infected. They may be infected for years  without problems. However, even if they do not have problems, they can pass the infection to their sexual partners. RISK FACTORS Genital warts are more likely to develop in:  People who have unprotected sex.  People who have multiple sexual partners.  People who become sexually active before they are 30 years of age.  Men who are not circumcised.  Women who have a female sexual partner who is not circumcised.  People who have a weakened  body defense system (immune system) due to disease or medicine.  People who smoke. SYMPTOMS Symptoms of genital warts include:  Small growths in the genital area or anal area. These warts often grow in clusters.  Itching and irritation in the genital area or anal area.  Bleeding from the warts.  Painful sexual intercourse. DIAGNOSIS Genital warts can usually be diagnosed from their appearance on the vagina, vulva, penis, perineum, anus, or rectum. Tests may also be done, such as:  Biopsy. A tissue sample is removed so it can be looked at under a microscope.  Colposcopy. In females, a magnifying tool is used to examine the vagina and cervix. Certain solutions may be used to make the HPV cells change color so they can be seen more easily.  A Pap test in females.  Tests for other STDs. TREATMENT Treatment for genital warts may include:  Applying prescription medicines to the warts. These may be solutions or creams.  Freezing the warts with liquid nitrogen (cryotherapy).  Burning the warts with:  Laser treatment.  An electrified probe (electrocautery).  Injecting a substance (Candida antigen or Trichophyton antigen) into the warts to help the body's immune system to fight off the warts.  Interferon injections.  Surgery to remove the warts. HOME CARE INSTRUCTIONS Medicines  Apply over-the-counter and prescription medicines only as told by your health care provider.  Do not treat genital warts with medicines that are used for treating hand warts.  Talk with your health care provider about using over-the-counter anti-itch creams. General Instructions  Do not touch or scratch the warts.  Do not have sex until your treatment has been completed.  Tell your current and past sexual partners about your condition because they may also need treatment.  Keep all follow-up visits as told by your health care provider. This is important.  After treatment, use condoms during  sex to prevent future infections. Other Instructions for Women  Women who have genital warts might need increased screening for cervical cancer. This type of cancer is slow growing and can be cured if it is found early. Chances of developing cervical cancer are increased with HPV.  If you become pregnant, tell your health care provider that you have had HPV. Your health care provider will monitor you closely during pregnancy to be sure that your baby is safe. PREVENTION Talk with your health care provider about getting the HPV vaccines. These vaccines prevent some HPV infections and cancers. It is recommended that the vaccine be given to males and females who are 119-30 years of age. It will not work if you already have HPV, and it is not recommended for pregnant women. SEEK MEDICAL CARE IF:  You have redness, swelling, or pain in the area of the treated skin.  You have a fever.  You feel generally ill.  You feel lumps in and around your genital area or anal area.  You have bleeding in your genital area or anal area.  You have pain during sexual intercourse.   This information is  not intended to replace advice given to you by your health care provider. Make sure you discuss any questions you have with your health care provider.   Document Released: 12/29/1999 Document Revised: 09/21/2014 Document Reviewed: 03/28/2014 Elsevier Interactive Patient Education [employer]

## 2015-05-22 NOTE — Progress Notes (Signed)
Patient ID: Brittney SnooksElizabeth Anne Estrada, female   DOB: 02/14/1985, 30 y.o.   MRN: [id number] 30 y.o. G0P0000 MarriedCaucasianF here for annual exam.  She is currently sexually active with a female partner. She got depressed on OCP's in the past. Declines contraception other than condoms. She has been with her husband for the last 10 years. No plans to have children. No dyspareunia.  She has a h/o severe condyloma as a teen, has extensive surgery. Then used Aldara, got rid of all of them except for one. It hasn't changed in size.  She has a small lump in her right axilla, was told it was a cyst. It hasn't changed in years.  Period Cycle (Days): 28 Period Duration (Days): 7 days  Period Pattern: Regular Menstrual Flow: Moderate Menstrual Control: Maxi pad, Thin pad Dysmenorrhea: (!) Moderate Dysmenorrhea Symptoms: Cramping, Nausea, Headache  Saturates a pad in 3-4 hours. No BTB. Cramps are helped some with Midol.   Patient's last menstrual period was 05/11/2015.          Sexually active: Yes.    The current method of family planning is condoms every time.    Exercising: Yes.    walking/ gardening  Smoker:  Former smoker   Health Maintenance: Pap:  2014 WNL per patient  History of abnormal Pap: yes in her teens. No surgery on her cervix.  MMG:  N/A Colonoscopy:  N/A BMD:   N/A TDaP:  11-10-12  Gardasil: no    reports that she quit smoking about 16 months ago. Her smoking use included Cigarettes. She has never used smokeless tobacco. She reports that she drinks alcohol.Rare ETOH use. 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   Past Medical History  Diagnosis Date  . Condyloma   . Scoliosis   . Genital warts     Past Surgical History  Procedure Laterality Date  . Condyloma excision/fulguration  2009    Current Outpatient Prescriptions  Medication Sig Dispense Refill  . Albuterol Sulfate (PROAIR RESPICLICK) 108 (90 Base) MCG/ACT AEPB Inhale 2 puffs into the lungs every 4 (four)  hours as needed. 1 each 1  . EPINEPHrine 0.3 mg/0.3 mL IJ SOAJ injection Use as directed for severe allergic reaction 2 Device 1  . fluticasone (FLONASE) 50 MCG/ACT nasal spray Place 2 sprays into both nostrils daily. 16 g 5  . levocetirizine (XYZAL) 5 MG tablet Take 1 tablet (5 mg total) by mouth every evening. 30 tablet 5  . rizatriptan (MAXALT-MLT) 10 MG disintegrating tablet Take 1 tablet (10 mg total) by mouth as needed for migraine. May repeat in 2 hours if needed 10 tablet 3   No current facility-administered medications for this visit.    Family History  Problem Relation Age of Onset  . Alcohol abuse Mother   . Depression Mother   . Hypertension Mother   . Diabetes Maternal Grandmother   . Allergic rhinitis Neg Hx   . Angioedema Neg Hx   . Asthma Neg Hx   . Atopy Neg Hx   . Eczema Neg Hx   . Immunodeficiency Neg Hx   . Urticaria Neg Hx     Review of Systems  Constitutional: Negative.   HENT: Negative.   Eyes: Negative.   Respiratory: Negative.   Cardiovascular: Negative.   Gastrointestinal: Negative.   Endocrine: Negative.   Genitourinary: Negative.   Musculoskeletal: Negative.   Skin: Negative.   Allergic/Immunologic: Negative.   Neurological: Negative.   Psychiatric/Behavioral: Negative.     Exam:  BP 110/70 mmHg  Pulse 80  Resp 15  Ht 5' 4.25" (1.632 m)  Wt 169 lb (76.658 kg)  BMI 28.78 kg/m2  LMP 05/11/2015  Weight change: @ Height:   Height: 5' 4.25" (163.2 cm)  Ht Readings from Last 3 Encounters:  05/22/15 5' 4.25" (1.632 m)  05/16/15 5' 4.37" (1.635 m)  04/19/14  (1.651 m)    General appearance: alert, cooperative and appears stated age Head: Normocephalic, without obvious abnormality, atraumatic Neck: no adenopathy, supple, symmetrical, trachea midline and thyroid normal to inspection and palpation Lungs: clear to auscultation bilaterally Breasts: normal appearance, no masses or tenderness Heart: regular rate and  rhythm Abdomen: soft, non-tender; bowel sounds normal; no masses,  no organomegaly Extremities: extremities normal, atraumatic, no cyanosis or edema Skin: Skin color, texture, turgor normal. No rashes or lesions Lymph nodes: Cervical, supraclavicular, and axillary nodes normal. No abnormal inguinal nodes palpated Neurologic: Grossly normal   Pelvic: External genitalia:  no lesions. On her mons, towards the right is a slightly raised, tan to brown flat lesion. Appears like a nevus to me. Patient states similar lesions diagnosed as warts in the past.              Urethra:  normal appearing urethra with no masses, tenderness or lesions              Bartholins and Skenes: normal                 Vagina: normal appearing vagina with normal color and discharge, no lesions              Cervix: no lesions               Bimanual Exam:  Uterus:  normal size, contour, position, consistency, mobility, non-tender              Adnexa: no mass, fullness, tenderness               Rectovaginal: Confirms               Anus:  normal sphincter tone, no lesions  Chaperone was present for exam.  A:  Well Woman with normal exam  H/O condyloma. It's not clear to me that the area in question is a wart. The patient states it looks exactly like her other proven warts. Discussed the option of leaving it alone (no change for years), or removal with definitive diagnosis  Dysmenorrhea  The patient reports a h/o an axillary lump, not felt by either of [place] today  P:   Pap with hpv  Continue with breast self exams, discussed calcium and vit D  Discussed exercise  Continue condoms for contraception, declines all other options of contraception (options reviewed)  Anaprox for cramps  Immunizations UTD  Any labs with primary

## 2015-05-23 ENCOUNTER — Ambulatory Visit: Payer: Managed Care, Other (non HMO) | Admitting: Obstetrics and Gynecology

## 2015-05-25 LAB — IPS PAP TEST WITH HPV

## 2015-06-02 ENCOUNTER — Ambulatory Visit: Payer: 59 | Admitting: Sports Medicine

## 2015-07-12 ENCOUNTER — Encounter: Payer: Self-pay | Admitting: Family Medicine

## 2015-07-12 ENCOUNTER — Ambulatory Visit (INDEPENDENT_AMBULATORY_CARE_PROVIDER_SITE_OTHER): Payer: Managed Care, Other (non HMO) | Admitting: Family Medicine

## 2015-07-12 VITALS — BP 118/77 | HR 71 | Ht 64.5 in | Wt 172.2 lb

## 2015-07-12 DIAGNOSIS — Z114 Encounter for screening for human immunodeficiency virus [HIV]: Secondary | ICD-10-CM | POA: Diagnosis not present

## 2015-07-12 DIAGNOSIS — R200 Anesthesia of skin: Secondary | ICD-10-CM | POA: Insufficient documentation

## 2015-07-12 DIAGNOSIS — M25532 Pain in left wrist: Secondary | ICD-10-CM

## 2015-07-12 DIAGNOSIS — E663 Overweight: Secondary | ICD-10-CM

## 2015-07-12 DIAGNOSIS — Z91018 Allergy to other foods: Secondary | ICD-10-CM

## 2015-07-12 DIAGNOSIS — R5383 Other fatigue: Secondary | ICD-10-CM | POA: Insufficient documentation

## 2015-07-12 DIAGNOSIS — F411 Generalized anxiety disorder: Secondary | ICD-10-CM

## 2015-07-12 DIAGNOSIS — R208 Other disturbances of skin sensation: Secondary | ICD-10-CM | POA: Diagnosis not present

## 2015-07-12 DIAGNOSIS — Z23 Encounter for immunization: Secondary | ICD-10-CM | POA: Diagnosis not present

## 2015-07-12 DIAGNOSIS — J3089 Other allergic rhinitis: Secondary | ICD-10-CM

## 2015-07-12 NOTE — Patient Instructions (Addendum)
Follow-up in 1-2 weeks for fasting blood work and then office visit with me one week later to discuss all results.                  Top Ten Foods for Health  1. Water Drink at least 8 to 12 cups of water daily. Consume half of your body weight in pounds, is the amount of water in ounces to drink daily.  Ie: a 200lb person = 100 oz water daily  2. Dark Green Vegetables Eat dark green vegetables at least three to four times a week. Good options include broccoli, peppers, brussel sprouts and leafy greens like kale and spinach.  3. Whole Grains Whole grains should be included in your diet at least two to three times daily. Look for whole wheat flour, rye, oatmeal, barley, amaranth, quinoa or a multigrain. A good source of fiber includes 3 to 4 grams of fiber per serving. A great source has 5 or more grams of fiber per serving.  4. Beans and Lentils Try to eat a bean-based meal at least once a week. Try to add legumes, including beans and lentils, to soups, stews, casseroles, salads and dips or eat them plain.  5. Fish Try to eat two to three serving of fish a week. A serving consists of 3 to 4 ounces of cooked fish. Good choices are salmon, trout, herring, bluefish, sardines and tuna.  6. Berries Include two to four servings of fruit in your diet each day. Try to eat berries such as raspberries, blueberries, blackberries and strawberries.  7. Winter Squash Eat butternut and acorn squash as well as other richly pigmented dark orange and green colored vegetables like sweet potato, cantaloupe and mango.  8. Soy 25 grams of soy protein a day is recommended as part of a low-fat diet to help lower cholesterol levels. Try tofu, soymilk, edamame soybeans, tempeh and texturized vegetable protein (TVP).  9. Flaxseed, Nuts and Seeds Add 1 to 2 tablespoons of ground flaxseed or other seeds to food each day or include a moderate amount of nuts - 1/4 cup - in your daily diet.  10.  Organic Yogurt Men and women between 3119 and 30 years of age need 1000 milligrams of calcium a day and 1200 milligrams if 50 or older. Eat calcium-rich foods such as nonfat or low-fat dairy products three to four times a day. Include organic choices.

## 2015-07-17 ENCOUNTER — Other Ambulatory Visit: Payer: Self-pay

## 2015-07-17 ENCOUNTER — Other Ambulatory Visit (INDEPENDENT_AMBULATORY_CARE_PROVIDER_SITE_OTHER): Payer: Managed Care, Other (non HMO)

## 2015-07-17 ENCOUNTER — Telehealth: Payer: Self-pay | Admitting: Family Medicine

## 2015-07-17 ENCOUNTER — Encounter: Payer: Self-pay | Admitting: Family Medicine

## 2015-07-17 DIAGNOSIS — E663 Overweight: Secondary | ICD-10-CM

## 2015-07-17 DIAGNOSIS — Z114 Encounter for screening for human immunodeficiency virus [HIV]: Secondary | ICD-10-CM | POA: Diagnosis not present

## 2015-07-17 DIAGNOSIS — R200 Anesthesia of skin: Secondary | ICD-10-CM

## 2015-07-17 DIAGNOSIS — R5383 Other fatigue: Secondary | ICD-10-CM

## 2015-07-17 NOTE — Telephone Encounter (Signed)
Brittney Estrada can you please add on a lipid panel for this patient if she was fasting when the blood work was obtained.

## 2015-07-17 NOTE — Assessment & Plan Note (Signed)
Patient denies that she has a history of generalized anxiety disorder and is upset this is in her chart.  We will address at a future office visit.  Once patient is more comfortable discussing her personal issues with me

## 2015-07-17 NOTE — Assessment & Plan Note (Signed)
We discussed strategies of preventing exposure to allergens.  Keep a food as well as environmental exposures journal along with symptoms she may get.

## 2015-07-17 NOTE — Assessment & Plan Note (Addendum)
Advised patient that if her allergist felt that the Xyzal and Flonase would help her, she should consider it.  In the least- use Neilmed sinus rinses twice a day.  Exposure prevention strategies discussed.

## 2015-07-17 NOTE — Assessment & Plan Note (Signed)
Counseled patient regarding BMI.

## 2015-07-17 NOTE — Progress Notes (Signed)
Carlye Grippeeborah J Alahia Whicker, D.O. [facility]   Subjective:    Chief Complaint  Patient presents with  . Establish Care  . Migraine    Pt states that she has a history of approximately 17 years of having migraines.  She states that the Maxalt tastes really bad and she wishes to discuss alternate therapy.   New pt, here to establish care.   HPI: Brittney Estrada is a 30 y.o. female who [facility] today For the first time.  Her main reasons for today's visit is to establish a primary care doctor and time permitting, would like to discuss migraines, neck pain, check chest tightness, immunizations and have a yearly physical.  Patient has been married for 3 years now.  No children.  She works at [employer] company.  Enjoys reading, art -painting and drawing- and gardening.  She went to 4 years of college and graduated with a BA in philosophy.  PCP prior:  [facility] She sees Dr. Gertie ExonJill Jertson of GYN And Dr. Nunzio CobbsBobbitt [facility]  She states that none of her prior doctors listen to her, and the medicines they prescribe her, never work.  She is hoping to find a doctor that will treat her properly, and "actually fix things."  Proair: Patient would like a refill of her inhaler, albuterol.  She tells me she has never had to use it, but certain smells and scents will set her off and make her feel like she can't breathe, like her chest will tighten up and it gives her headaches  Maxalt - for Migraines:  Perfumes and other scents are a major trigger for her migraines.  She tells me that as long as she avoids those smells, she never needs the medicine.  Works just okay.    -She had told me she had to leave several  jobs in the past because they were not accommodating to her.  Currently she is at odds with her coworkers because they sometimes wear perfumes and know how  upsetting it is to the patient, but do it anyway.  Naproxen  for Dysmenorrhea- Dr Oscar LaJertson, but she tells me that it doesn't really help  Flonase: She never uses it.  She was told by her allergist that she was allergic to grass and dogs and told to use the Xyzal and Flonase.  She tells me that she really is not allergic to those things and the doctor didn't know what he was talking about.   She does not use Flonase nor Xyzal, but is "supposed to"  be taking them.        Past Medical History  Diagnosis Date  . Condyloma   . Scoliosis   . Genital warts   . Environmental allergies   . Migraine without aura       Past Surgical History  Procedure Laterality Date  . Condyloma excision/fulguration  2009      Family History  Problem Relation Age of Onset  . Alcohol abuse Mother   . Depression Mother   . Hypertension Mother   . Diabetes Maternal Grandmother   . Allergic rhinitis Neg Hx   . Angioedema Neg Hx   . Asthma Neg Hx   . Atopy Neg Hx   . Eczema Neg Hx   . Immunodeficiency Neg Hx   .  Urticaria Neg Hx       History  Drug Use Not on file  ,    History  Alcohol Use  . 0.0 oz/week  . 0 Standard drinks or equivalent per week    Comment: socially   ,    History  Smoking status  . Former Smoker  . Types: Cigarettes  . Quit date: 01/14/2014  Smokeless tobacco  . Never Used  ,     History  Sexual Activity  . Sexual Activity:  . Partners: Female, Female  . Birth Control/ Protection: Condom      Patient's Medications  New Prescriptions   No medications on file  Previous Medications   ALBUTEROL SULFATE (PROAIR RESPICLICK) 108 (90 BASE) MCG/ACT AEPB    Inhale 2 puffs into the lungs every 4 (four) hours as needed.   EPINEPHRINE 0.3 MG/0.3 ML IJ SOAJ INJECTION    Use as directed for severe allergic reaction   FLUTICASONE (FLONASE) 50 MCG/ACT NASAL SPRAY    Place 2 sprays into both nostrils daily.   LEVOCETIRIZINE (XYZAL) 5 MG TABLET    Take 1 tablet (5  mg total) by mouth every evening.   NAPROXEN SODIUM (ANAPROX DS) 550 MG TABLET    1 tab po q 12 hours prn pain   RIZATRIPTAN (MAXALT-MLT) 10 MG DISINTEGRATING TABLET    Take 1 tablet (10 mg total) by mouth as needed for migraine. May repeat in 2 hours if needed  Modified Medications   No medications on file  Discontinued Medications   No medications on file     Latex and Other Outpatient Encounter Prescriptions as of 07/12/2015  Medication Sig  . Albuterol Sulfate (PROAIR RESPICLICK) 108 (90 Base) MCG/ACT AEPB Inhale 2 puffs into the lungs every 4 (four) hours as needed.  Marland Kitchen EPINEPHrine 0.3 mg/0.3 mL IJ SOAJ injection Use as directed for severe allergic reaction  . fluticasone (FLONASE) 50 MCG/ACT nasal spray Place 2 sprays into both nostrils daily.  Marland Kitchen levocetirizine (XYZAL) 5 MG tablet Take 1 tablet (5 mg total) by mouth every evening.  . naproxen sodium (ANAPROX DS) 550 MG tablet 1 tab po q 12 hours prn pain  . rizatriptan (MAXALT-MLT) 10 MG disintegrating tablet Take 1 tablet (10 mg total) by mouth as needed for migraine. May repeat in 2 hours if needed   No facility-administered encounter medications on file as of 07/12/2015.      There are no preventive care reminders to display for this patient.   Immunization History  Administered Date(s) Administered  . Hepatitis B, adult 07/12/2015  . Tdap 11/10/2012     Fall Risk  07/12/2015  Falls in the past year? No     Depression screen PHQ 2/9 07/12/2015  Decreased Interest 0  Down, Depressed, Hopeless 0  PHQ - 2 Score 0      Review of Systems:   ( Completed via Adult Medical History Intake form today ) General:   Denies fever, chills, appetite changes, unexplained weight loss.  Optho/Auditory:   Denies visual changes, blurred vision/LOV, ringing in ears/ diff hearing Respiratory:   Denies SOB, DOE, cough, wheezing.  Cardiovascular:   Denies chest pain, but gets tightness when exposed to perfumes/ feels like she can't  breath, no palpitations, no new onset peripheral edema  Gastrointestinal:   Denies nausea, vomiting, diarrhea.  Genitourinary:    Denies dysuria, increased frequency, flank pain.  Endocrine:     Denies hot or cold intolerance, polyuria, polydipsia. Musculoskeletal:  Denies unexplained  myalgias, joint swelling, arthralgias, gait problems.  Skin:  Denies rash, suspicious lesions or new/ changes in moles Neurological:    Denies dizziness, syncope, unexplained weakness, lightheadedness, numbness , + HA- h/o migraines. Psychiatric/Behavioral:   Denies mood changes, suicidal or homicidal ideations, hallucinations    Objective:   Blood pressure 118/77, pulse 71, height 5' 4.5" (1.638 m), weight 172 lb 3.2 oz (78.109 kg), last menstrual period 07/07/2015. Body mass index is 29.11 kg/(m^2).  General: Well Developed, well nourished, and in no acute distress.  Neuro: Alert and oriented x3, extra-ocular muscles intact, sensation grossly intact.  HEENT: Normocephalic, atraumatic, pupils equal round reactive to light, neck supple, no gross masses, no carotid bruits, no JVD apprec Skin: no gross suspicious lesions or rashes  Cardiac: Regular rate and rhythm, no murmurs rubs or gallops.  Respiratory:  clear to auscultation bilaterally. Not using accessory muscles, speaking in full sentences.  Abdominal: Soft, not grossly distended Musculoskeletal: Ambulates w/o diff, FROM * 4 ext.  Vasc: less 2 sec cap RF, warm and pink  Psych:  No HI/SI, judgement and insight questionable, appears depressed and aggitated.    Impression and Recommendations:    The patient was counselled, risk factors were discussed, anticipatory guidance given.  Please see AVS handed out to patient at the end of our visit for further patient instructions/ counseling done pertaining to today's office visit.  Overweight (BMI 25.0-29.9) Counseled patient regarding BMI.  Migraine Not currently having symptoms per patient.  Has  Maxalt to use when necessary.  I discussed with patient that we can use Ca Chan Blockers, neurontin or topamax for prevention in the future or if she prefers, or she can be sent to a neurologist/ migraine specialist in the future if that's what she prefers  Generalized anxiety disorder Patient denies that she has a history of generalized anxiety disorder and is upset this is in her chart.  We will address at a future office visit.  Once patient is more comfortable discussing her personal issues with me  Food allergy vs ideosyncratic reaction We discussed strategies of preventing exposure to allergens.  Keep a food as well as environmental exposures journal along with symptoms she may get.    Perennial and seasonal allergic rhinitis with a vasomotor component Advised patient that if her allergist felt that the Xyzal and Flonase would help her, she should consider it.  In the least- use Neilmed sinus rinses twice a day.  Exposure prevention strategies discussed.   Gross side effects, risk and benefits, and alternatives of medications discussed with patient.  Patient is aware that all medications have potential side effects and we are unable to predict every sideeffect or drug-drug interaction that may occur.  Expresses verbal understanding and consents to current therapy plan and treatment regiment.  Note: This document was prepared using Dragon voice recognition software and may include unintentional dictation errors.

## 2015-07-17 NOTE — Assessment & Plan Note (Addendum)
Not currently having symptoms per patient.  Has Maxalt to use when necessary.  I discussed with patient that we can use Ca Chan Blockers, neurontin or topamax for prevention in the future or if she prefers, or she can be sent to a neurologist/ migraine specialist in the future if that's what she prefers

## 2015-07-18 LAB — HEMOGLOBIN A1C
HEMOGLOBIN A1C: 5.2 % (ref <5.7)
Mean Plasma Glucose: 103 mg/dL

## 2015-07-18 LAB — CBC
HCT: 38.6 % (ref 35.0–45.0)
Hemoglobin: 12.8 g/dL (ref 11.7–15.5)
MCH: 28.5 pg (ref 27.0–33.0)
MCHC: 33.2 g/dL (ref 32.0–36.0)
MCV: 86 fL (ref 80.0–100.0)
MPV: 10.6 fL (ref 7.5–12.5)
Platelets: 313 10*3/uL (ref 140–400)
RBC: 4.49 MIL/uL (ref 3.80–5.10)
RDW: 12.9 % (ref 11.0–15.0)
WBC: 4.4 10*3/uL (ref 3.8–10.8)

## 2015-07-18 LAB — VITAMIN B12: VITAMIN B 12: 327 pg/mL (ref 200–1100)

## 2015-07-18 LAB — COMPREHENSIVE METABOLIC PANEL
ALT: 9 U/L (ref 6–29)
AST: 14 U/L (ref 10–30)
Albumin: 4.2 g/dL (ref 3.6–5.1)
Alkaline Phosphatase: 73 U/L (ref 33–115)
BUN: 8 mg/dL (ref 7–25)
CHLORIDE: 104 mmol/L (ref 98–110)
CO2: 25 mmol/L (ref 20–31)
Calcium: 9.2 mg/dL (ref 8.6–10.2)
Creat: 0.97 mg/dL (ref 0.50–1.10)
Glucose, Bld: 72 mg/dL (ref 65–99)
POTASSIUM: 5 mmol/L (ref 3.5–5.3)
Sodium: 138 mmol/L (ref 135–146)
TOTAL PROTEIN: 6.6 g/dL (ref 6.1–8.1)
Total Bilirubin: 0.5 mg/dL (ref 0.2–1.2)

## 2015-07-18 LAB — LIPID PANEL
Cholesterol: 161 mg/dL (ref 125–200)
HDL: 40 mg/dL — ABNORMAL LOW (ref >=46)
LDL Cholesterol: 99 mg/dL (ref <130)
Total CHOL/HDL Ratio: 4 Ratio (ref <=5.0)
Triglycerides: 109 mg/dL (ref <150)
VLDL: 22 mg/dL (ref <30)

## 2015-07-18 LAB — HIV ANTIBODY (ROUTINE TESTING W REFLEX): HIV 1&2 Ab, 4th Generation: NONREACTIVE

## 2015-07-18 LAB — VITAMIN D 25 HYDROXY (VIT D DEFICIENCY, FRACTURES): Vit D, 25-Hydroxy: 9 ng/mL — ABNORMAL LOW (ref 30–100)

## 2015-07-18 LAB — TSH: TSH: 2.54 mIU/L

## 2015-07-26 ENCOUNTER — Encounter: Payer: Self-pay | Admitting: Family Medicine

## 2015-07-26 ENCOUNTER — Ambulatory Visit (INDEPENDENT_AMBULATORY_CARE_PROVIDER_SITE_OTHER): Payer: Managed Care, Other (non HMO) | Admitting: Family Medicine

## 2015-07-26 VITALS — BP 121/83 | HR 96 | Ht 64.5 in | Wt 167.1 lb

## 2015-07-26 DIAGNOSIS — R4589 Other symptoms and signs involving emotional state: Secondary | ICD-10-CM

## 2015-07-26 DIAGNOSIS — E559 Vitamin D deficiency, unspecified: Secondary | ICD-10-CM | POA: Diagnosis not present

## 2015-07-26 DIAGNOSIS — Z789 Other specified health status: Secondary | ICD-10-CM

## 2015-07-26 DIAGNOSIS — E663 Overweight: Secondary | ICD-10-CM

## 2015-07-26 DIAGNOSIS — E786 Lipoprotein deficiency: Secondary | ICD-10-CM | POA: Diagnosis not present

## 2015-07-26 MED ORDER — VITAMIN D (ERGOCALCIFEROL) 1.25 MG (50000 UNIT) PO CAPS: 50000.0000 [IU] | ORAL_CAPSULE | ORAL | Status: DC

## 2015-07-26 MED ORDER — VITAMIN D3 125 MCG (5000 UT) PO TABS: ORAL_TABLET | ORAL | Status: DC

## 2015-07-26 NOTE — Progress Notes (Signed)
Subjective:    Chief Complaint  Patient presents with  . Results    review lab results    HPI: Brittney Estrada is a 30 y.o. female who presents to [facility] today for Health education and counseling, specifically for f/up labs and discuss results.    Patient is not taking allergy medicine as I recommended. States she doesn't have any allergies.   Patient did not keep a food as well as environmental exposures journal along with the symptoms she may get.    She is not doing the twice a day Lloyd Huger med sinus rinses per my suggestion last office visit.  When asked how her migraines are and if she's had any, patient states she has no history of migraine and no need to discuss that.     Past Medical History  Diagnosis Date  . Condyloma   . Scoliosis   . Genital warts   . Environmental allergies   . Migraine without aura   . Generalized anxiety disorder 05/05/2015    05/11/2015 PHQ9 = 3, GAD7 = 18   . Migraine 05/05/2015     Past Surgical History  Procedure Laterality Date  . Condyloma excision/fulguration  2009     Family History  Problem Relation Age of Onset  . Alcohol abuse Mother   . Depression Mother   . Hypertension Mother   . Diabetes Maternal Grandmother   . Allergic rhinitis Neg Hx   . Angioedema Neg Hx   . Asthma Neg Hx   . Atopy Neg Hx   . Eczema Neg Hx   . Immunodeficiency Neg Hx   . Urticaria Neg Hx      History  Drug Use Not on file  ,  History  Alcohol Use  . 0.0 oz/week  . 0 Standard drinks or equivalent per week    Comment: socially   ,  History  Smoking status  . Former Smoker  . Types: Cigarettes  . Quit date: 01/14/2014  Smokeless tobacco  . Never Used  ,  History  Sexual Activity  . Sexual Activity:  . Partners: Female, Female  . Birth Control/ Protection: Condom      Current Outpatient Prescriptions on File Prior to Visit  Medication Sig Dispense Refill  . Albuterol Sulfate  (PROAIR RESPICLICK) 108 (90 Base) MCG/ACT AEPB Inhale 2 puffs into the lungs every 4 (four) hours as needed. 1 each 1  . EPINEPHrine 0.3 mg/0.3 mL IJ SOAJ injection Use as directed for severe allergic reaction 2 Device 1  . fluticasone (FLONASE) 50 MCG/ACT nasal spray Place 2 sprays into both nostrils daily. 16 g 5  . levocetirizine (XYZAL) 5 MG tablet Take 1 tablet (5 mg total) by mouth every evening. 30 tablet 5  . naproxen sodium (ANAPROX DS) 550 MG tablet 1 tab po q 12 hours prn pain 30 tablet 2   No current facility-administered medications on file prior to visit.    Allergies  Allergen Reactions  . Latex   . Other     mint      Review of Systems:  ( Completed via adult medical history intake form today ) General:  Denies fever, chills, appetite changes, unexplained weight loss.  Respiratory: Denies SOB, DOE, cough, wheezing.  Cardiovascular: Denies chest pain, palpitations.  Gastrointestinal: Denies nausea, vomiting, diarrhea, abdominal pain.  Genitourinary: Denies dysuria, increased frequency, flank pain. Endocrine: Denies hot or cold intolerance, polyuria, polydipsia. Musculoskeletal: Denies  myalgias, back pain, joint swelling, arthralgias, gait problems.  Skin: Denies pallor, rash, suspicious lesions.  Neurological: Denies dizziness, seizures, syncope, unexplained weakness, lightheadedness, numbness and headaches.  Psychiatric/Behavioral: Denies mood changes, suicidal or homicidal ideations, hallucinations, sleep disturbances.    Objective:    Blood pressure 121/83, pulse 96, height 5' 4.5" (1.638 m), weight 167 lb 1.6 oz (75.796 kg), last menstrual period 07/07/2015. Body mass index is 28.25 kg/(m^2). General: Well Developed, well nourished, and in no acute distress.  HEENT: Normocephalic, atraumatic Skin: Warm and dry, cap RF less 2 sec Cardiac: Regular rate  Respiratory: Not using accessory muscles, speaking in full sentences. Psych: A and O *3,tensing of jaw,  clenching posture, reddened face, crying, judgement and insight questionable  NeuroM-Sk: Ambulates w/o assistance, moves ext * 4 w/o difficulty, sensation grossly intact.  Recent Results (from the past 2160 hour(s))  Pap Test with HP (IPS)     Status: None   Collection Time: 05/22/15  3:47 PM  Result Value Ref Range   COMMENTS: [facility]     Comment: [address], [place], [place] [id number] [address] [place], [place] [id number] GYN CYTOLOGY REPORT  PATIENT NAME:Brittney Estrada PATHOLOGY#:[id number]: F DOB: 01/03/1986 (Age: [age]) MEDICAL RECORD VHQION:[id number]:[id number] DOCTOR:Evelyn Oscar LaJertson DATE OBTAINED:5/8/2017CLIENT:[facility] DATE RECEIVED:5/10/2017OTHER PHYS: DATE SIGNED:05/25/2015 PAP Thinlayer with HPV Final Cytologic Interpretation:       Cervical, ThinLayer with Automated Imaging and Dual Review, CPT 88175      Negative for Intraepithelial Lesions or Malignancy.       ADEQUACY OF SPECIMEN:           Satisfactory for evaluation. Endocervical cells/transformation zone component identified.             NOTE: This Pap test has been evaluated with computer assisted technology.       Electronically signed by: NDH, CT(ASCP), [address], [place], [place], (Med. Dir.: Eulogio DitchZachary Lewis, MD) ndh/5/11/2017The Pap test is a screening mechanism with excellent but not perfect ability to prevent cervic al carcinoma.  It has a low, but  significant, diagnostic error rate. The pap test is suboptimal  for detection of glandular lesions.  It should be noted that a negative result does not definitively rule out the presence of disease.Ref: DeMay, RM, The Art and Science of Cytopathology,  [employer], [phone]. HPV Results   High Risk HPV -    Not Detected  Reference Range = Not Detected A result of "Detected" signifies the presence of one or more high risk types of HPV.  The APTIMA HPV Assay is an in vitro nucleic acid amplification test for the qualitative  detection of E6/E7 viral messenger RNA (mRNA) from 14 high-risk types of  human papillomavirus (HPV) in cervical specimens. The high-risk HPV types detected by the assay include: 16, 18, 31, 33, 35, 39, 45, 51, 52, 56, 58, 59, 66, and 68. APTIMA HPV method will be performed on the [employer] Panther System.  The APTIMA HPV Assay is designed to enhance existing methods for the detection of cervical disease and should be us ed in conjunction with clinical information derived from other diagnostic and screening tests, physical examinations, and full medical  history in accordance with appropriate patient management procedures. The APTIMA HPV Assay on ThinPrep(tm) PreservCyt(tm) specimens is FDA approved on the [employer] Panther System.The APTIMA HPV Assay on SurePath(tm) specimens was developed and its performance characteristics determined by [facility] [place].   It has not been cleared or approved by  the U.S. Food and Drug Administration.  The FDA has determined that such clearance or approval is not necessary.  This test is used for clinical purposes.  This laboratory is certified under the Clinical Laboratory  improvement Amendments of 1988 (CLIA) as qualified to perform high complexity clinical laboratory testing. Electronically signed by: Matilde Sprang, MT(ASCP) [address], [place], [place] (Med. Dir.: Eulogio Ditch) Last Menstrual Period: 05/11/15  Technical processing performed at [facility] [employer] [facility], [address], [place], [place] [address], CLIA# [id number].   HIV antibody (with reflex)     Status: None   Collection Time: 07/17/15  8:23 AM  Result Value Ref Range   HIV 1&2 Ab, 4th Generation NONREACTIVE NONREACTIVE    Comment:   HIV-1 antigen and HIV-1/HIV-2 antibodies were not detected.  There is no laboratory evidence of HIV infection.   HIV-1/2 Antibody Diff        Not indicated. HIV-1 RNA, Qual TMA          Not indicated.     PLEASE NOTE: This information  has been disclosed to you from records whose confidentiality may be protected by state law. If your state requires such protection, then the state law prohibits you from making any further disclosure of the information without the specific written consent of the person to whom it pertains, or as otherwise permitted by law. A general authorization for the release of medical or other information is NOT sufficient for this purpose.   The performance of this assay has not been clinically validated in patients less than 50 years old.   For additional information please refer to http://education.questdiagnostics.com/faq/FAQ106.  (This link is being provided for informational/educational purposes only.)     CBC     Status: None   Collection Time: 07/17/15  8:23 AM  Result Value Ref Range   WBC 4.4 3.8 - 10.8 K/uL   RBC 4.49 3.80 - 5.10 MIL/uL   Hemoglobin 12.8 11.7 - 15.5 g/dL   HCT 21.3 08.6 - 57.8 %   MCV 86.0 80.0 - 100.0 fL   MCH 28.5 27.0 - 33.0 pg   MCHC 33.2 32.0 - 36.0 g/dL   RDW 46.9 62.9 - 52.8 %   Platelets 313 140 - 400 K/uL   MPV 10.6 7.5 - 12.5 fL    Comment: ** Please note change in unit of measure and reference range(s). **  Comprehensive metabolic panel     Status: None   Collection Time: 07/17/15  8:23 AM  Result Value Ref Range   Sodium 138 135 - 146 mmol/L   Potassium 5.0 3.5 - 5.3 mmol/L   Chloride 104 98 - 110 mmol/L   CO2 25 20 - 31 mmol/L   Glucose, Bld 72 65 - 99 mg/dL   BUN 8 7 - 25 mg/dL   Creat 4.13 2.44 - 0.10 mg/dL   Total Bilirubin 0.5 0.2 - 1.2 mg/dL   Alkaline Phosphatase 73 33 - 115 U/L   AST 14 10 - 30 U/L   ALT 9 6 - 29 U/L   Total Protein 6.6 6.1 - 8.1 g/dL   Albumin 4.2 3.6 - 5.1 g/dL   Calcium 9.2 8.6 - 27.2 mg/dL  Hemoglobin Z3G     Status: None   Collection Time: 07/17/15  8:23 AM  Result Value Ref Range   Hgb A1c MFr Bld 5.2 <5.7 %    Comment:   For the purpose of screening for the presence of diabetes:   <  5.7%       Consistent  with the absence of diabetes 5.7-6.4 %   Consistent with increased risk for diabetes (prediabetes) >=6.5 %     Consistent with diabetes   This assay result is consistent with a decreased risk of diabetes.   Currently, no consensus exists regarding use of hemoglobin A1c for diagnosis of diabetes in children.   According to American Diabetes Association (ADA) guidelines, hemoglobin A1c <7.0% represents optimal control in non-pregnant diabetic patients. Different metrics may apply to specific patient populations. Standards of Medical Care in Diabetes (ADA).      Mean Plasma Glucose 103 mg/dL  TSH     Status: None   Collection Time: 07/17/15  8:23 AM  Result Value Ref Range   TSH 2.54 mIU/L    Comment:   Reference Range   > or = 20 Years  0.40-4.50   Pregnancy Range First trimester  0.26-2.66 Second trimester 0.55-2.73 Third trimester  0.43-2.91     Vitamin B12     Status: None   Collection Time: 07/17/15  8:23 AM  Result Value Ref Range   Vitamin B-12 327 200 - 1100 pg/mL  VITAMIN D 25 Hydroxy (Vit-D Deficiency, Fractures)     Status: Abnormal   Collection Time: 07/17/15  8:23 AM  Result Value Ref Range   Vit D, 25-Hydroxy 9 (L) 30 - 100 ng/mL    Comment: Vitamin D Status           25-OH Vitamin D        Deficiency                <20 ng/mL        Insufficiency         20 - 29 ng/mL        Optimal             > or = 30 ng/mL   For 25-OH Vitamin D testing on patients on D2-supplementation and patients for whom quantitation of D2 and D3 fractions is required, the QuestAssureD 25-OH VIT D, (D2,D3), LC/MS/MS is recommended: order code 16109 (patients > 2 yrs).   Lipid panel     Status: Abnormal   Collection Time: 07/17/15  8:23 AM  Result Value Ref Range   Cholesterol 161 125 - 200 mg/dL   Triglycerides 604 <540 mg/dL   HDL 40 (L) >=98 mg/dL   Total CHOL/HDL Ratio 4.0 <=5.0 Ratio   VLDL 22 <30 mg/dL   LDL Cholesterol 99 <119 mg/dL    Comment:   Total Cholesterol/HDL  Ratio:CHD Risk                        Coronary Heart Disease Risk Table                                        Men       Women          1/2 Average Risk              3.4        3.3              Average Risk              5.0        4.4           2X  Average Risk              9.6        7.1           3X Average Risk             23.4       11.0 Use the calculated Patient Ratio above and the CHD Risk table  to determine the patient's CHD Risk.        Impression and Recommendations:    1. Vitamin D deficiency   2. Overweight (BMI 25.0-29.9)   3. Low HDL (under 40)   4. Defensive coping   5. Emotional barrier to health education   6. Agitation    Move more- goal to 5 d/wk.  The patient was counselled, risk factors were discussed, anticipatory guidance given.  Pt was in the office today for 40+ minutes, with over 50% time spent in face to face counseling of various medical concerns and in coordination of care Please see AVS handed out to patient at the end of our visit for further patient instructions/ counseling done pertaining to today's office visit. Pt declines med RF's.   Note: This document was prepared using Dragon voice recognition software and may include unintentional dictation errors.

## 2015-07-26 NOTE — Patient Instructions (Signed)
5 min walking twice or three times daily      Cholesterol Cholesterol is a fat. Your body needs a small amount of cholesterol. Cholesterol may build up in your blood vessels. This increases your chance of having a heart attack or stroke. You cannot feel your cholesterol levels. The only way to know your cholesterol level is high is with a blood test. Keep your test results. Work with your doctor to keep your cholesterol at a good level. WHAT DO THE TEST RESULTS MEAN?  Total cholesterol is how much cholesterol is in your blood.  LDL is bad cholesterol. This is the type that can build up. You want LDL to be low.  HDL is good cholesterol. It cleans your blood vessels and carries LDL away. You want HDL to be high.  Triglycerides are fat that the body can burn for energy or store. WHAT ARE GOOD LEVELS OF CHOLESTEROL?  Total cholesterol below 200.  LDL below 100 for people at risk. Below 70 for those at very high risk.  HDL above 50 is good. Above 60 is best.  Triglycerides below 150. HOW CAN I LOWER MY CHOLESTEROL?  Diet. Follow your diet programs as told by your doctor.  Choose fish, white meat chicken, roasted Malaw[place]urkey, or baked [place]urkey. Try not to eat red meat, fried foods, or processed meats such as sausage and lunch meats.  Eat lots of fresh fruits and vegetables.  Choose whole grains, beans, pasta, potatoes, and cereals.  Use only small amounts of olive, corn, or canola oils.  Try not to eat butter, mayonnaise, shortening, or palm kernel oils.  Try not to eat foods with trans fats.  Drink skim or nonfat milk. Eat low-fat or nonfat yogurt and cheeses. Try not to drink whole milk or cream. Try not to eat ice cream, egg yolks, and full-fat cheeses.  Healthy desserts include angel food cake, ginger snaps, animal crackers, hard candy, popsicles, and low-fat or nonfat frozen yogurt. Try not to eat pastries, cakes, pies, and cookies.  Exercise. Follow your exercise programs as  told by your doctor.  Be more active. You can try gardening, walking, or taking the stairs. Ask your doctor about how you can be more active.  Medicine. Take medicine as told by your doctor.   This information is not intended to replace advice given to you by your health care provider. Make sure you discuss any questions you have with your health care provider.   Document Released: 03/29/2008 Document Revised: 01/21/2014 Document Reviewed: 10/14/2012 Elsevier Interactive Patient Education [employer]2016 Elsevier Inc.

## 2015-08-05 DIAGNOSIS — E786 Lipoprotein deficiency: Secondary | ICD-10-CM | POA: Insufficient documentation

## 2015-08-05 DIAGNOSIS — R451 Restlessness and agitation: Secondary | ICD-10-CM | POA: Insufficient documentation

## 2015-08-05 DIAGNOSIS — R4589 Other symptoms and signs involving emotional state: Secondary | ICD-10-CM | POA: Insufficient documentation

## 2015-08-05 DIAGNOSIS — Z789 Other specified health status: Secondary | ICD-10-CM | POA: Insufficient documentation

## 2015-08-05 DIAGNOSIS — R4689 Other symptoms and signs involving appearance and behavior: Secondary | ICD-10-CM | POA: Insufficient documentation

## 2015-09-06 ENCOUNTER — Ambulatory Visit: Payer: Managed Care, Other (non HMO)

## 2015-09-14 ENCOUNTER — Ambulatory Visit (INDEPENDENT_AMBULATORY_CARE_PROVIDER_SITE_OTHER): Payer: Managed Care, Other (non HMO)

## 2015-09-14 DIAGNOSIS — Z23 Encounter for immunization: Secondary | ICD-10-CM

## 2015-09-20 ENCOUNTER — Ambulatory Visit: Payer: Managed Care, Other (non HMO) | Admitting: Allergy and Immunology

## 2015-09-25 ENCOUNTER — Ambulatory Visit: Payer: Managed Care, Other (non HMO) | Admitting: Allergy and Immunology

## 2015-12-26 ENCOUNTER — Ambulatory Visit (INDEPENDENT_AMBULATORY_CARE_PROVIDER_SITE_OTHER): Payer: BLUE CROSS/BLUE SHIELD | Admitting: Family Medicine

## 2015-12-26 ENCOUNTER — Encounter: Payer: Self-pay | Admitting: Family Medicine

## 2015-12-26 VITALS — BP 118/84 | HR 85 | Ht 64.5 in | Wt 169.4 lb

## 2015-12-26 DIAGNOSIS — E786 Lipoprotein deficiency: Secondary | ICD-10-CM | POA: Diagnosis not present

## 2015-12-26 DIAGNOSIS — G43009 Migraine without aura, not intractable, without status migrainosus: Secondary | ICD-10-CM | POA: Diagnosis not present

## 2015-12-26 DIAGNOSIS — E559 Vitamin D deficiency, unspecified: Secondary | ICD-10-CM | POA: Diagnosis not present

## 2015-12-26 DIAGNOSIS — E663 Overweight: Secondary | ICD-10-CM | POA: Diagnosis not present

## 2015-12-26 NOTE — Progress Notes (Signed)
Impression and Recommendations:    1. Overweight (BMI 25.0-29.9)   2. Vitamin D deficiency   3. Low HDL (under 40)   4. Migraine without aura and without status migrainosus, not intractable      Patient is doing very well. She is exercising 30 minutes daily. Asked her to continue and increase intensity and if possible increased from 5-7 days weekly. Congratulated her on her achievements.  - She is looking emotionally and physically well and I'm very happy for her and her job.    New Prescriptions   No medications on file    Modified Medications   No medications on file    Discontinued Medications   LEVOCETIRIZINE (XYZAL) 5 MG TABLET    Take 1 tablet (5 mg total) by mouth every evening.    The patient was counseled, risk factors were discussed, anticipatory guidance given.  Gross side effects, risk and benefits, and alternatives of medications and treatment plan in general discussed with patient.  Patient is aware that all medications have potential side effects and we are unable to predict every side effect or drug-drug interaction that may occur.   Patient will call with any questions prior to using medication if they have concerns.  Expresses verbal understanding and consents to current therapy and treatment regimen.  No barriers to understanding were identified.  Red flag symptoms and signs discussed in detail.  Patient expressed understanding regarding what to do in case of emergency\urgent symptoms  Return for 6-12 mo.  Please see AVS handed out to patient at the end of our visit for further patient instructions/ counseling done pertaining to today's office visit.    Note: This document was prepared using Dragon voice recognition software and may include unintentional dictation  errors.   --------------------------------------------------------------------------------------------------------------------------------------------------------------------------------------------------------------------------------------------    Subjective:    CC:  Chief Complaint  Patient presents with  . Follow-up    HPI: Brittney Estrada is a 30 y.o. female who [facility] for issues as discussed below.  Vit d def- been takign weekly and daily doesse for hx of Vit D at 9 in July 3.  No diff in fatigue or energy levels - pt notices no differrence on meds.   Low HDL:  Walks around park 30min daily 5 days per week.  Drinking less diet soda  [facility]   Started baclofen prn migraines, and trigger pt injections.   Getting migraines now--> mod/ mild twice monthly.  No severe ones.    New job--started sept--> [name] now.   Much happy- less stress/ diff stress now.     Wt Readings from Last 3 Encounters:  12/26/15 169 lb 6.4 oz (76.8 kg)  07/26/15 167 lb 1.6 oz (75.8 kg)  07/12/15 172 lb 3.2 oz (78.1 kg)   BP Readings from Last 3 Encounters:  12/26/15 118/84  07/26/15 121/83  07/12/15 118/77   Pulse Readings from Last 3 Encounters:  12/26/15 85  07/26/15 96  07/12/15 71   BMI Readings from Last 3 Encounters:  12/26/15 28.63 kg/m  07/26/15 28.24 kg/m  07/12/15 29.10 kg/m     Patient Care Team    Relationship Specialty Notifications Start End  Thomasene Loteborah Zoiee Wimmer, DO PCP - General Family Medicine  07/12/15   Brittney GladMarshall Freeman, MD Referring Physician Neurology  12/26/15    Comment: [facility]- headache [facility] Board certified    Patient Active Problem List  Diagnosis Date Noted  . Low HDL (under 40) 08/05/2015    Priority: High  . Emotional barrier to health education 08/05/2015    Priority: High  . Vitamin D deficiency 07/26/2015    Priority:  Medium  . Overweight (BMI 25.0-29.9) 07/17/2015    Priority: Medium  . Food allergy vs ideosyncratic reaction 05/16/2015    Priority: Low  . Generalized anxiety disorder 05/05/2015    Priority: Low  . Agitation 08/05/2015  . Defensive coping 08/05/2015  . Displacement of Aggression 08/05/2015  . Mild occasional fatigue 07/12/2015  . h/o Facial numbness 07/12/2015  . Perennial and seasonal allergic rhinitis with a vasomotor component 05/16/2015  . Dyspnea 05/16/2015  . Migraine 05/05/2015  . Preventive measure 11/10/2012  . Myofascial pain syndrome, cervical 09/30/2012  . Left wrist pain 09/09/2012    Past Medical history, Surgical history, Family history, Social history, Allergies and Medications have been entered into the medical record, reviewed and changed as needed.   Allergies:  Allergies  Allergen Reactions  . Latex   . Other     mint    ROS   Objective:   Blood pressure 118/84, pulse 85, height 5' 4.5" (1.638 m), weight 169 lb 6.4 oz (76.8 kg). Body mass index is 28.63 kg/m. General: Well Developed, well nourished, appropriate for stated age.  Neuro: Alert and oriented x3, extra-ocular muscles intact, sensation grossly intact.  HEENT: Normocephalic, atraumatic, neck supple   Skin: Warm and dry, no gross rash. Cardiac: RRR, S1 S2,  no murmurs rubs or gallops.  Respiratory: ECTA B/L, Not using accessory muscles, speaking in full sentences-unlabored. Vascular:  No gross lower ext edema, cap RF less 2 sec. Psych: No HI/SI, judgement and insight good, Euthymic mood. Full Affect.

## 2015-12-26 NOTE — Patient Instructions (Signed)

## 2015-12-27 LAB — VITAMIN D 25 HYDROXY (VIT D DEFICIENCY, FRACTURES): Vit D, 25-Hydroxy: 29 ng/mL — ABNORMAL LOW (ref 30–100)

## 2015-12-27 NOTE — Progress Notes (Signed)
LVM for pt to call to discuss results.  T. Larua Collier, CMA  

## 2015-12-27 NOTE — Progress Notes (Signed)
LVM for to call to discuss results.  Tiajuana Amass. Nelson, CMA

## 2016-01-11 ENCOUNTER — Ambulatory Visit (INDEPENDENT_AMBULATORY_CARE_PROVIDER_SITE_OTHER): Payer: BLUE CROSS/BLUE SHIELD

## 2016-01-11 VITALS — Temp 98.3°F

## 2016-01-11 DIAGNOSIS — Z23 Encounter for immunization: Secondary | ICD-10-CM

## 2016-01-11 NOTE — Progress Notes (Signed)
Pt here for Hepatitis B injection only.  Pt tolerated injection well. VIS provided to patient.  Tiajuana Amass. Anastacia Reinecke, CMA

## 2016-01-22 NOTE — Assessment & Plan Note (Signed)
Cont with [facility]--->   Dr Neale BurlyFreeman

## 2016-01-22 NOTE — Assessment & Plan Note (Signed)
Counseling done- advised wt loss.  If interested in actively managing weight:  Use lose it or my fitness pal---> track all food and drinks.   Discussed with patient importance of weight loss to help achieve health goals and how increasing weight, correlates to increasing risk of various diseases. (or increasing risk of not controlling existing diseases.)  F/up sooner than planned if you would like to further discuss strategies to lose weight  - Weight watchers recommended;   Declines nutrition counseling with dietary specialist 

## 2016-01-22 NOTE — Assessment & Plan Note (Signed)
Cont supp.   Check levels 6-mo or so

## 2016-01-22 NOTE — Assessment & Plan Note (Signed)
Counseling done- diet and lifestyle changes  All questions answered  Handouts given if patient desired them

## 2016-05-22 ENCOUNTER — Encounter: Payer: Self-pay | Admitting: Obstetrics and Gynecology

## 2016-05-22 ENCOUNTER — Ambulatory Visit (INDEPENDENT_AMBULATORY_CARE_PROVIDER_SITE_OTHER): Payer: BLUE CROSS/BLUE SHIELD | Admitting: Obstetrics and Gynecology

## 2016-05-22 VITALS — BP 112/70 | HR 92 | Resp 16 | Ht 64.5 in | Wt 170.0 lb

## 2016-05-22 DIAGNOSIS — Z124 Encounter for screening for malignant neoplasm of cervix: Secondary | ICD-10-CM

## 2016-05-22 DIAGNOSIS — Z01419 Encounter for gynecological examination (general) (routine) without abnormal findings: Secondary | ICD-10-CM

## 2016-05-22 NOTE — Progress Notes (Signed)
31 y.o. G0P0000 MarriedCaucasianF here for annual exam.   She c/o increased body odor in her axilla, just during her cycle. She uses an antiperspirant, can't use a deodorant (smells cause migraines). Symptoms have worsened since she turned 30.   Period Cycle (Days): 28 Period Duration (Days): 5- 7 days  Period Pattern: Regular Menstrual Flow: Moderate, Heavy Menstrual Control: Maxi pad Menstrual Control Change Freq (Hours): changes pad every 1-2 hours  Dysmenorrhea: (!) Severe Dysmenorrhea Symptoms: Cramping  She see's a neurologist for her migraines, not supposed to take NSAID's secondary to rebound migraines. She gets headaches and cramps with her cycle, can't function for one day a month. She gets migraines at different times a month, currently down to 5 x a month (down from daily). She tried OCP's in the past, caused depression (has tried several types of pills). Using condoms for contraception, declines the IUD. No dyspareunia.   Patient's last menstrual period was 04/30/2016.          Sexually active: Yes.    The current method of family planning is condoms every time.    Exercising: Yes.    walking Smoker:  Former smoker   Health Maintenance: Pap:  05-22-15 WNl NEG HR HPV History of abnormal Pap: yes- years ago  MMG:  Never TDaP:  11-10-12 Gardasil: no   reports that she quit smoking about 2 years ago. Her smoking use included Cigarettes. She has never used smokeless tobacco. She reports that she drinks alcohol. She reports that she does not use drugs. She works at [employer], Audiological scientist.   Past Medical History:  Diagnosis Date  . Condyloma   . Environmental allergies   . Generalized anxiety disorder 05/05/2015   05/11/2015 PHQ9 = 3, GAD7 = 18   . Genital warts   . Migraine 05/05/2015  . Migraine without aura   . Scoliosis     Past Surgical History:  Procedure Laterality Date  . CONDYLOMA EXCISION/FULGURATION  2009    Current Outpatient Prescriptions   Medication Sig Dispense Refill  . Albuterol Sulfate (PROAIR RESPICLICK) 108 (90 Base) MCG/ACT AEPB Inhale 2 puffs into the lungs every 4 (four) hours as needed. 1 each 1  . Cholecalciferol (VITAMIN D3) 5000 units TABS 5,000 IU OTC vitamin D3 daily. 90 tablet 3  . cyclobenzaprine (FLEXERIL) 5 MG tablet   0  . EPINEPHrine 0.3 mg/0.3 mL IJ SOAJ injection Use as directed for severe allergic reaction 2 Device 1  . naratriptan (AMERGE) 2.5 MG tablet   0  . Vitamin D, Ergocalciferol, (DRISDOL) 50000 units CAPS capsule Take 1 capsule (50,000 Units total) by mouth every 7 (seven) days. Take for 8 total doses(weeks) 12 capsule 4   No current facility-administered medications for this visit.     Family History  Problem Relation Age of Onset  . Alcohol abuse Mother   . Depression Mother   . Hypertension Mother   . Diabetes Maternal Grandmother   . Allergic rhinitis Neg Hx   . Angioedema Neg Hx   . Asthma Neg Hx   . Atopy Neg Hx   . Eczema Neg Hx   . Immunodeficiency Neg Hx   . Urticaria Neg Hx     Review of Systems  Constitutional: Negative.   HENT: Negative.   Eyes: Negative.   Respiratory: Negative.   Cardiovascular: Negative.   Gastrointestinal: Negative.   Endocrine:       Craving sweets  Genitourinary: Negative.   Musculoskeletal: Negative.   Skin: Negative.  Allergic/Immunologic: Negative.   Neurological: Negative.   Psychiatric/Behavioral: Negative.     Exam:   BP 112/70 (BP Location: Right Arm, Patient Position: Sitting, Cuff Size: Normal)   Pulse 92   Resp 16   Ht 5' 4.5" (1.638 m)   Wt 170 lb (77.1 kg)   LMP 04/30/2016   BMI 28.73 kg/m   Weight change: @WEIGHTCHANGE @ Height:   Height: 5' 4.5" (163.8 cm)  Ht Readings from Last 3 Encounters:  05/22/16 5' 4.5" (1.638 m)  12/26/15 5' 4.5" (1.638 m)  07/26/15 5' 4.5" (1.638 m)    General appearance: alert, cooperative and appears stated age Head: Normocephalic, without obvious abnormality, atraumatic Neck: no  adenopathy, supple, symmetrical, trachea midline and thyroid normal to inspection and palpation Lungs: clear to auscultation bilaterally Cardiovascular: regular rate and rhythm Breasts: normal appearance, no masses or tenderness Abdomen: soft, non-tender; bowel sounds normal; no masses,  no organomegaly Extremities: extremities normal, atraumatic, no cyanosis or edema Skin: Skin color, texture, turgor normal. No rashes or lesions Lymph nodes: Cervical, supraclavicular, and axillary nodes normal. No abnormal inguinal nodes palpated Neurologic: Grossly normal   Pelvic: External genitalia:  no lesions              Urethra:  normal appearing urethra with no masses, tenderness or lesions              Bartholins and Skenes: normal                 Vagina: normal appearing vagina with normal color and discharge, no lesions              Cervix: no lesions               Bimanual Exam:  Uterus:  normal size, contour, position, consistency, mobility, non-tender              Adnexa: no mass, fullness, tenderness               Rectovaginal: Confirms               Anus:  normal sphincter tone, no lesions  Chaperone was present for exam.  A:  Well Woman with normal exam  P:   Pap with reflex hpv (patient desires yearly)  Labs with primary MD  Discussed breast self exam  Discussed calcium and vit D intake  Condoms for contraception, declines other forms of contraception.

## 2016-05-22 NOTE — Patient Instructions (Signed)

## 2016-05-24 LAB — IPS PAP TEST WITH REFLEX TO HPV

## 2016-07-25 ENCOUNTER — Encounter: Payer: Self-pay | Admitting: Podiatry

## 2016-07-25 ENCOUNTER — Ambulatory Visit (INDEPENDENT_AMBULATORY_CARE_PROVIDER_SITE_OTHER): Payer: BLUE CROSS/BLUE SHIELD | Admitting: Podiatry

## 2016-07-25 ENCOUNTER — Ambulatory Visit (INDEPENDENT_AMBULATORY_CARE_PROVIDER_SITE_OTHER): Payer: BLUE CROSS/BLUE SHIELD

## 2016-07-25 VITALS — BP 132/105 | HR 76

## 2016-07-25 DIAGNOSIS — M79671 Pain in right foot: Secondary | ICD-10-CM

## 2016-07-25 DIAGNOSIS — R600 Localized edema: Secondary | ICD-10-CM | POA: Diagnosis not present

## 2016-07-25 MED ORDER — DICLOFENAC SODIUM 75 MG PO TBEC: 75.0000 mg | DELAYED_RELEASE_TABLET | Freq: Two times a day (BID) | ORAL | 2 refills | Status: DC
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
## 2016-07-25 NOTE — Progress Notes (Signed)
Subjective:    Patient ID: Brittney SnooksElizabeth Anne Allegretto, female   DOB: 31 y.o.   MRN: [id number]   HPI patient presents stating that she was running to her car and developed discomfort in her foot 3 months ago and it's remained swollen since that    Review of Systems  All other systems reviewed and are negative.       Objective:  Physical Exam  Cardiovascular: Intact distal pulses.   Musculoskeletal: Normal range of motion.  Neurological: She is alert.  Skin: Skin is warm.  Nursing note and vitals reviewed.  Neurovascular status intact muscle strength adequate range of motion within normal limits with patient found to have significant forefoot edema right foot with negative Homans sign noted. It is not pitting in nature and there is mild discoloration with mottled appearance and there is discomfort that is diffuse with no area of specific discomfort. Found to have good digital perfusion and well oriented 3     Assessment:   Possibility for chronic pain syndrome which may have developed secondary to trauma of 3 months ago versus possibility for bone injury or other inflammatory condition      Plan:    H&P and condition reviewed with patient. At this point I do want to try to reduce the edema and see if we can get her in a better way even though this may require other practitioners for physical therapy as far as treatment goes. Today I applied an Radio broadcast assistantUnna boot Ace wrap and surgical shoe and gave her strict instructions if it should be too tight for her to take it off I'm hoping she can tolerate it for 3-4 days. The she then we'll switch over to a compression stocking and I will see her back in 3 weeks and I also placed on diclofenac 75 mg twice a day that she is can to check with her migraine Dr. and hopefully be able to take. We will evaluate the response over 3 weeks and decide who else may be appropriate to try to help the condition she is experiencing  X-ray report indicated no signs of  fracture no signs spot osteoporosis at the current time

## 2016-08-13 ENCOUNTER — Encounter: Payer: Self-pay | Admitting: Certified Nurse Midwife

## 2016-08-13 ENCOUNTER — Ambulatory Visit (INDEPENDENT_AMBULATORY_CARE_PROVIDER_SITE_OTHER): Payer: BLUE CROSS/BLUE SHIELD | Admitting: Certified Nurse Midwife

## 2016-08-13 VITALS — BP 122/80 | HR 76 | Temp 98.5°F | Ht 64.5 in | Wt 173.0 lb

## 2016-08-13 DIAGNOSIS — R3915 Urgency of urination: Secondary | ICD-10-CM

## 2016-08-13 DIAGNOSIS — B3731 Acute candidiasis of vulva and vagina: Secondary | ICD-10-CM

## 2016-08-13 DIAGNOSIS — B373 Candidiasis of vulva and vagina: Secondary | ICD-10-CM | POA: Diagnosis not present

## 2016-08-13 DIAGNOSIS — R350 Frequency of micturition: Secondary | ICD-10-CM

## 2016-08-13 DIAGNOSIS — R300 Dysuria: Secondary | ICD-10-CM

## 2016-08-13 LAB — POCT URINALYSIS DIPSTICK
BILIRUBIN UA: NEGATIVE
Blood, UA: NEGATIVE
GLUCOSE UA: NEGATIVE
Ketones, UA: NEGATIVE
Nitrite, UA: NEGATIVE
Protein, UA: NEGATIVE
Urobilinogen, UA: 0.2 E.U./dL
pH, UA: 7 (ref 5.0–8.0)

## 2016-08-13 MED ORDER — FLUCONAZOLE 150 MG PO TABS: ORAL_TABLET | ORAL | 0 refills | Status: DC

## 2016-08-13 NOTE — Progress Notes (Signed)
31 y.o. Married Caucasian female G0P0000 here with complaint of UTI, with onset about a week and a half ago. Patient complaining of urinary frequency only and pain with urination but only when touches skin. Patient denies fever, chills, nausea or back pain. No new personal products. Patient feels not related to sexual activity. Complaining of increase in vaginal discharge and slight odor, but burning vaginal symptoms.   Contraception is condom. Patient is consuming adequate water intake.  Review of Systems  Constitutional: Negative.   HENT: Negative.   Eyes: Negative.   Respiratory: Negative.   Cardiovascular: Negative.   Gastrointestinal: Negative.   Genitourinary: Positive for dysuria, frequency and urgency.  Musculoskeletal: Negative.   Skin: Negative.   Neurological: Negative.   Endo/Heme/Allergies: Negative.   Psychiatric/Behavioral: Negative.     O: Healthy female WDWN Affect: Normal, orientation x 3 Skin : warm and dry CVAT: negative bilateral Abdomen: negative for suprapubic tenderness  Pelvic exam: External genital area: slight red appearance no lesions Bladder,Urethra not tender, Urethral meatus, not: tender,not red Vagina:white copious slighly odorousl vaginal discharge, normal appearance  Wet prep taken, ph 4.0 Cervix: normal, non tender Uterus:normal,non tender Adnexa: normal non tender, no fullness or masses   A: Yeast vaginitis Normal pelvic exam  P: Reviewed findings of Yeast vagintis and need for treatment.Discussed can sometimes mimic UTI symptoms. Discussed Aveeno or baking soda sitz bath for comfort.  Rx: Diflucan see order with instructions Reviewed warning signs and symptoms of UTI and need to advise if occurring. Encouraged to limit soda, tea, and coffee and be sure to increase water intake.   RV prn

## 2016-08-13 NOTE — Patient Instructions (Signed)

## 2016-08-15 ENCOUNTER — Ambulatory Visit (INDEPENDENT_AMBULATORY_CARE_PROVIDER_SITE_OTHER): Payer: BLUE CROSS/BLUE SHIELD | Admitting: Podiatry

## 2016-08-15 ENCOUNTER — Encounter: Payer: Self-pay | Admitting: Podiatry

## 2016-08-15 DIAGNOSIS — R600 Localized edema: Secondary | ICD-10-CM

## 2016-08-15 DIAGNOSIS — G894 Chronic pain syndrome: Secondary | ICD-10-CM | POA: Diagnosis not present

## 2016-08-16 NOTE — Progress Notes (Signed)
Subjective:    Patient ID: Brittney Estrada, female   DOB: 31 y.o.   MRN: [id number]   HPI patient states her foot seems some better but it still swelling and it's not as painful as it was but it intermittently she still gets quite a bit of discomfort    ROS      Objective:  Physical Exam neurovascular status unchanged with diminishment of edema in the right foot and ankle with good range of motion and mottled skin which has improved from previous visit with better digital motion. Patient has discomfort when palpated but quite a bit better than previous     Assessment:    Probability for low-grade chronic pain syndrome which appears to be making improvement with compression Unna boot usage and home physical therapy     Plan:    H&P condition reviewed and I do think ultimately this may require physical therapy but I am very hopeful that we will break whenever cycle she may be in and at this time I went ahead and I reapplied Unna boot instructed on continued compression and exercises and soaks and reappoint to recheck

## 2016-08-28 ENCOUNTER — Encounter: Payer: Self-pay | Admitting: Obstetrics and Gynecology

## 2016-08-28 ENCOUNTER — Ambulatory Visit (INDEPENDENT_AMBULATORY_CARE_PROVIDER_SITE_OTHER): Payer: BLUE CROSS/BLUE SHIELD | Admitting: Obstetrics and Gynecology

## 2016-08-28 ENCOUNTER — Telehealth: Payer: Self-pay | Admitting: Certified Nurse Midwife

## 2016-08-28 VITALS — BP 120/64 | HR 80 | Temp 98.2°F | Ht 64.5 in | Wt 171.0 lb

## 2016-08-28 DIAGNOSIS — R319 Hematuria, unspecified: Secondary | ICD-10-CM | POA: Diagnosis not present

## 2016-08-28 DIAGNOSIS — R350 Frequency of micturition: Secondary | ICD-10-CM | POA: Diagnosis not present

## 2016-08-28 LAB — POCT URINALYSIS DIPSTICK
BILIRUBIN UA: NEGATIVE
GLUCOSE UA: NEGATIVE
Ketones, UA: NEGATIVE
Nitrite, UA: NEGATIVE
Protein, UA: NEGATIVE
Urobilinogen, UA: 0.2 E.U./dL
pH, UA: 5 (ref 5.0–8.0)

## 2016-08-28 MED ORDER — SULFAMETHOXAZOLE-TRIMETHOPRIM 800-160 MG PO TABS: 1.0000 | ORAL_TABLET | Freq: Two times a day (BID) | ORAL | 0 refills | Status: DC

## 2016-08-28 MED ORDER — FLUCONAZOLE 150 MG PO TABS: 150.0000 mg | ORAL_TABLET | Freq: Once | ORAL | 0 refills | Status: AC

## 2016-08-28 NOTE — Patient Instructions (Signed)

## 2016-08-28 NOTE — Progress Notes (Signed)
GYNECOLOGY  VISIT   HPI: 31 y.o.   Married  Caucasian  female   G0P0000 with Patient's last menstrual period was 08/19/2016 (exact date).   here for urinary frequency and left lower pelvic pain. Patient was seen 2 weeks ago with same symptoms and treated for yeast infection with Diflucan.  Symptoms improved slightly but returned after her menses began.  Pain and urgency to void.  At the end of her menses so cannot tell is she having blood in the urine.   No recent intercourse or change in partner.  No fever, nausea, vomiting, or back pain.   Last UTI was a long time ago.  No hx of pyelo or renal stones.   Does not like taking AZO.   No vaginal discharge, odor, or itching.   Urine ZOX:WRUEA WBCs, Mod.RBCs Temp: 98.2  GYNECOLOGIC HISTORY: Patient's last menstrual period was 08/19/2016 (exact date). Contraception: condom Menopausal hormone therapy:  n/a Last mammogram:  n/a Last pap smear:  05-22-16 Neg; 05-22-15 Neg:Neg HR HPV        OB History    Gravida Para Term Preterm AB Living   0 0 0 0 0 0   SAB TAB Ectopic Multiple Live Births   0 0 0 0 0         Patient Active Problem List   Diagnosis Date Noted  . Low HDL (under 40) 08/05/2015  . Agitation 08/05/2015  . Defensive coping 08/05/2015  . Emotional barrier to health education 08/05/2015  . Displacement of Aggression 08/05/2015  . Vitamin D deficiency 07/26/2015  . Overweight (BMI 25.0-29.9) 07/17/2015  . Mild occasional fatigue 07/12/2015  . h/o Facial numbness 07/12/2015  . Food allergy vs ideosyncratic reaction 05/16/2015  . Perennial and seasonal allergic rhinitis with a vasomotor component 05/16/2015  . Dyspnea 05/16/2015  . Migraine 05/05/2015  . Generalized anxiety disorder 05/05/2015  . Preventive measure 11/10/2012  . Myofascial pain syndrome, cervical 09/30/2012  . Left wrist pain 09/09/2012    Past Medical History:  Diagnosis Date  . Condyloma   . Environmental allergies   . Generalized anxiety  disorder 05/05/2015   05/11/2015 PHQ9 = 3, GAD7 = 18   . Genital warts   . Migraine 05/05/2015  . Migraine without aura   . Scoliosis     Past Surgical History:  Procedure Laterality Date  . CONDYLOMA EXCISION/FULGURATION  2009    Current Outpatient Prescriptions  Medication Sig Dispense Refill  . Albuterol Sulfate (PROAIR RESPICLICK) 108 (90 Base) MCG/ACT AEPB Inhale 2 puffs into the lungs every 4 (four) hours as needed. 1 each 1  . Cholecalciferol (VITAMIN D3) 5000 units TABS 5,000 IU OTC vitamin D3 daily. 90 tablet 3  . EPINEPHrine 0.3 mg/0.3 mL IJ SOAJ injection Use as directed for severe allergic reaction 2 Device 1  . naratriptan (AMERGE) 2.5 MG tablet   0  . tiZANidine (ZANAFLEX) 4 MG tablet as needed for migraine.  0   No current facility-administered medications for this visit.      ALLERGIES: Diclofenac; Latex; and Other  Family History  Problem Relation Age of Onset  . Alcohol abuse Mother   . Depression Mother   . Hypertension Mother   . Diabetes Maternal Grandmother   . Allergic rhinitis Neg Hx   . Angioedema Neg Hx   . Asthma Neg Hx   . Atopy Neg Hx   . Eczema Neg Hx   . Immunodeficiency Neg Hx   . Urticaria Neg Hx  Social History   Social History  . Marital status: Married    Spouse name: N/A  . Number of children: N/A  . Years of education: N/A   Occupational History  . Not on file.   Social History Main Topics  . Smoking status: Former Smoker    Types: Cigarettes    Quit date: 01/14/2014  . Smokeless tobacco: Never Used  . Alcohol use 0.0 oz/week     Comment: socially   . Drug use: No  . Sexual activity: Yes    Partners: Female, Female    Birth control/ protection: Condom   Other Topics Concern  . Not on file   Social History Narrative  . No narrative on file    ROS:  Pertinent items are noted in HPI.  PHYSICAL EXAMINATION:    BP 120/64 (BP Location: Right Arm, Patient Position: Sitting, Cuff Size: Small)   Pulse 80   Temp  98.2 F (36.8 C) (Oral)   Ht 5' 4.5" (1.638 m)   Wt 171 lb (77.6 kg)   LMP 08/19/2016 (Exact Date)   BMI 28.90 kg/m     General appearance: alert, cooperative and appears stated age   Abdomen: soft, non-tender, no masses,  no organomegaly Back:  No CVA tenderness.  Pelvic: External genitalia:  no lesions              Urethra:  normal appearing urethra with no masses, tenderness or lesions              Bartholins and Skenes: normal                 Vagina: normal appearing vagina with normal color and discharge, no lesions              Cervix: no lesions.  Bloody mucous noted from cervical os.  Has cervical motion tenderness.                Bimanual Exam:  Uterus:  normal size, contour, position, consistency, mobility, mildly tender.              Adnexa: no mass, fullness, tenderness              Chaperone was present for exam.  ASSESSMENT  Probable UTI.  On menses.  Cervical motion tenderness.  PLAN  Urine micro and cx. GC/CT.  Will treat for UTI with Bactrim DS po bid x 3 days.  Diflucan 150 mg po x 1.  May repeat in 72 hours.  Hydrate well.   An After Visit Summary was printed and given to the patient.  __15____ minutes face to face time of which over 50% was spent in counseling.

## 2016-08-28 NOTE — Telephone Encounter (Signed)
Patient was seen last week for yeast infection and is not better. Scheduled appointment for 08/30/16.  Patient is having pain as well.

## 2016-08-28 NOTE — Telephone Encounter (Addendum)
Spoke with patient. Patient states she is unable to talk in detail, is at work. Very uncomfortable, symptoms have not resolved, would like to be seen today. Patient scheduled for today at 1:30pm with Dr. Edward JollySilva, OV for 8/17 with Dr. Edward JollySilva cancelled.   Routing to provider for final review. Patient is agreeable to disposition. Will close encounter.  Cc: Leota Sauerseborah Leonard, CNM

## 2016-08-28 NOTE — Addendum Note (Signed)
Addended by: Ardell IsaacsAMUNDSON C SILVA, Debbe BalesBROOK E on: 08/28/2016 03:03 PM   Modules accepted: Orders

## 2016-08-29 LAB — URINALYSIS, MICROSCOPIC ONLY
BACTERIA UA: NONE SEEN
CASTS: NONE SEEN /LPF

## 2016-08-29 LAB — URINE CULTURE

## 2016-08-29 LAB — GC/CHLAMYDIA PROBE AMP
Chlamydia trachomatis, NAA: NEGATIVE
NEISSERIA GONORRHOEAE BY PCR: NEGATIVE

## 2016-08-30 ENCOUNTER — Ambulatory Visit: Payer: BLUE CROSS/BLUE SHIELD | Admitting: Obstetrics and Gynecology

## 2016-09-05 ENCOUNTER — Encounter: Payer: Self-pay | Admitting: Certified Nurse Midwife

## 2016-09-05 ENCOUNTER — Ambulatory Visit (INDEPENDENT_AMBULATORY_CARE_PROVIDER_SITE_OTHER): Payer: BLUE CROSS/BLUE SHIELD | Admitting: Certified Nurse Midwife

## 2016-09-05 VITALS — BP 114/72 | HR 76 | Temp 98.2°F | Resp 16 | Ht 64.5 in | Wt 171.0 lb

## 2016-09-05 DIAGNOSIS — Z91419 Personal history of unspecified adult abuse: Secondary | ICD-10-CM

## 2016-09-05 DIAGNOSIS — N390 Urinary tract infection, site not specified: Secondary | ICD-10-CM | POA: Diagnosis not present

## 2016-09-05 DIAGNOSIS — R319 Hematuria, unspecified: Secondary | ICD-10-CM

## 2016-09-05 LAB — POCT URINALYSIS DIPSTICK
Bilirubin, UA: NEGATIVE
Glucose, UA: NEGATIVE
Ketones, UA: NEGATIVE
Leukocytes, UA: NEGATIVE
Nitrite, UA: NEGATIVE
PROTEIN UA: NEGATIVE
UROBILINOGEN UA: NEGATIVE U/dL — AB
pH, UA: 5 (ref 5.0–8.0)

## 2016-09-05 MED ORDER — NITROFURANTOIN MONOHYD MACRO 100 MG PO CAPS: 100.0000 mg | ORAL_CAPSULE | Freq: Two times a day (BID) | ORAL | 0 refills | Status: DC
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
## 2016-09-05 NOTE — Patient Instructions (Signed)

## 2016-09-06 LAB — URINALYSIS, MICROSCOPIC ONLY: CASTS: NONE SEEN /LPF

## 2016-09-06 LAB — URINE CULTURE: ORGANISM ID, BACTERIA: NO GROWTH

## 2016-09-12 ENCOUNTER — Ambulatory Visit: Payer: BLUE CROSS/BLUE SHIELD | Admitting: Podiatry

## 2016-10-31 ENCOUNTER — Other Ambulatory Visit: Payer: Self-pay | Admitting: Orthopedic Surgery

## 2016-10-31 DIAGNOSIS — M25571 Pain in right ankle and joints of right foot: Principal | ICD-10-CM

## 2016-10-31 DIAGNOSIS — G8929 Other chronic pain: Secondary | ICD-10-CM

## 2016-11-10 ENCOUNTER — Ambulatory Visit
Admission: RE | Admit: 2016-11-10 | Discharge: 2016-11-10 | Disposition: A | Discharge status: Home / Self Care | Payer: Managed Care, Other (non HMO) | Source: Ambulatory Visit | Attending: Orthopedic Surgery | Admitting: Orthopedic Surgery

## 2016-11-10 DIAGNOSIS — M25571 Pain in right ankle and joints of right foot: Principal | ICD-10-CM

## 2016-11-10 DIAGNOSIS — G8929 Other chronic pain: Secondary | ICD-10-CM

## 2017-01-15 ENCOUNTER — Telehealth (INDEPENDENT_AMBULATORY_CARE_PROVIDER_SITE_OTHER): Payer: Self-pay

## 2017-01-15 NOTE — Telephone Encounter (Signed)
Pt emailed the office for a new pt appt for second opinion left ankle. I called and lm to advise that first available appt is 01/21/17 and can see Dr. Lajoyce Cornersuda. Advised to call and make appt. Just wanted to give FYI or can try to call again and make appt.

## 2017-01-21 ENCOUNTER — Encounter (INDEPENDENT_AMBULATORY_CARE_PROVIDER_SITE_OTHER): Payer: Self-pay | Admitting: Orthopedic Surgery

## 2017-01-21 ENCOUNTER — Ambulatory Visit (INDEPENDENT_AMBULATORY_CARE_PROVIDER_SITE_OTHER): Payer: BLUE CROSS/BLUE SHIELD | Admitting: Orthopedic Surgery

## 2017-01-21 DIAGNOSIS — M25871 Other specified joint disorders, right ankle and foot: Secondary | ICD-10-CM | POA: Diagnosis not present

## 2017-01-21 MED ORDER — LIDOCAINE HCL 1 % IJ SOLN
2.0000 mL | INTRAMUSCULAR | Status: AC | PRN
  Administered 2017-01-21: 2 mL

## 2017-01-21 MED ORDER — METHYLPREDNISOLONE ACETATE 40 MG/ML IJ SUSP
40.0000 mg | INTRAMUSCULAR | Status: AC | PRN
  Administered 2017-01-21: 40 mg via INTRA_ARTICULAR
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
## 2017-01-24 ENCOUNTER — Encounter: Payer: Self-pay | Admitting: Family Medicine

## 2017-02-18 ENCOUNTER — Ambulatory Visit (INDEPENDENT_AMBULATORY_CARE_PROVIDER_SITE_OTHER): Payer: BLUE CROSS/BLUE SHIELD | Admitting: Orthopedic Surgery

## 2017-02-18 ENCOUNTER — Encounter (INDEPENDENT_AMBULATORY_CARE_PROVIDER_SITE_OTHER): Payer: Self-pay | Admitting: Orthopedic Surgery

## 2017-02-18 VITALS — Ht 64.0 in | Wt 171.0 lb

## 2017-02-18 DIAGNOSIS — M25871 Other specified joint disorders, right ankle and foot: Secondary | ICD-10-CM
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
## 2017-02-24 ENCOUNTER — Encounter: Payer: Self-pay | Admitting: Family Medicine

## 2017-02-24 ENCOUNTER — Ambulatory Visit: Payer: BLUE CROSS/BLUE SHIELD | Admitting: Family Medicine

## 2017-02-24 VITALS — BP 126/84 | HR 79 | Temp 99.2°F | Ht 64.0 in | Wt 174.7 lb

## 2017-02-24 DIAGNOSIS — R0602 Shortness of breath: Secondary | ICD-10-CM

## 2017-02-24 DIAGNOSIS — Z87891 Personal history of nicotine dependence: Secondary | ICD-10-CM

## 2017-02-24 DIAGNOSIS — R52 Pain, unspecified: Secondary | ICD-10-CM | POA: Diagnosis not present

## 2017-02-24 DIAGNOSIS — R05 Cough: Secondary | ICD-10-CM | POA: Diagnosis not present

## 2017-02-24 DIAGNOSIS — J40 Bronchitis, not specified as acute or chronic: Secondary | ICD-10-CM | POA: Diagnosis not present

## 2017-02-24 DIAGNOSIS — R059 Cough, unspecified: Secondary | ICD-10-CM

## 2017-02-24 DIAGNOSIS — R509 Fever, unspecified: Secondary | ICD-10-CM

## 2017-02-24 LAB — POCT INFLUENZA A/B
INFLUENZA B, POC: NEGATIVE
Influenza A, POC: NEGATIVE

## 2017-02-24 MED ORDER — ALBUTEROL SULFATE HFA 108 (90 BASE) MCG/ACT IN AERS: 1.0000 | INHALATION_SPRAY | RESPIRATORY_TRACT | 2 refills | Status: DC | PRN

## 2017-02-24 MED ORDER — PREDNISONE 20 MG PO TABS: ORAL_TABLET | ORAL | 0 refills | Status: DC

## 2017-02-24 MED ORDER — HYDROCOD POLST-CPM POLST ER 10-8 MG/5ML PO SUER: 5.0000 mL | Freq: Two times a day (BID) | ORAL | 0 refills | Status: DC | PRN

## 2017-02-24 MED ORDER — FLUCONAZOLE 150 MG PO TABS: ORAL_TABLET | ORAL | 0 refills | Status: DC

## 2017-02-24 MED ORDER — FLUCONAZOLE 150 MG PO TABS: 150.0000 mg | ORAL_TABLET | Freq: Once | ORAL | 0 refills | Status: DC

## 2017-02-24 MED ORDER — AMOXICILLIN-POT CLAVULANATE 875-125 MG PO TABS: 1.0000 | ORAL_TABLET | Freq: Two times a day (BID) | ORAL | 0 refills | Status: DC

## 2017-02-24 NOTE — Progress Notes (Addendum)
Acute Care Office visit  Assessment and plan:  1. Body aches   2. History of smoking for 2-5 years-3-5 cigarettes/day for 10 years quit 2017   3. Cough   4. Shortness of breath   5. Fever and chills   6. Bronchitis    1. Body aches- push fluids, get plenty of rest. Take OTC medications for symptom relief.  2. H/o smoking for 2-5 years- continue not smoking.  3. Cough meds- start inhaler to use PRN.    4. SOB- start meds as listed below. Start abx. Use humidifier at bedside at night.   5. Fever and chills- Take OTC medications like tylenol and/or ibuprofen for symptom relief.   - Viral vs Allergic vs Bacterial causes for pt's symptoms reveiwed.    - Supportive care and various OTC medications discussed in addition to any prescribed. - Call or RTC if new symptoms, or if no improvement or worse over next several days.   - Will consider ABX if sx continue past 10 days and worsening if not already given.    Meds ordered this encounter  Medications  . amoxicillin-clavulanate (AUGMENTIN) 875-125 MG tablet    Sig: Take 1 tablet by mouth 2 (two) times daily.    Dispense:  20 tablet    Refill:  0  . chlorpheniramine-HYDROcodone (TUSSIONEX) 10-8 MG/5ML SUER    Sig: Take 5 mLs by mouth every 12 (twelve) hours as needed for cough (cough, will cause drowsiness.).    Dispense:  200 mL    Refill:  0  . albuterol (PROVENTIL HFA;VENTOLIN HFA) 108 (90 Base) MCG/ACT inhaler    Sig: Inhale 1-2 puffs into the lungs every 4 (four) hours as needed for wheezing or shortness of breath.    Dispense:  1 Inhaler    Refill:  2  . predniSONE (DELTASONE) 20 MG tablet    Sig: Take 3 pills a day for 2 days, 2 pills a day for 2 days, 1 pill a day for 2 days then one half pill a day for 2 days then off    Dispense:  14 tablet    Refill:  0  . DISCONTD: fluconazole (DIFLUCAN) 150 MG tablet    Sig: Take 1 tablet (150 mg total) by mouth once for 1 dose. Repeat in 7 days if symptoms continue    Dispense:   2 tablet    Refill:  0  . fluconazole (DIFLUCAN) 150 MG tablet    Sig: Take 1 tablet at end of antibiotics, then repeat in 1 week    Dispense:  2 tablet    Refill:  0    Orders Placed This Encounter  Procedures  . POCT Influenza A/B    Gross side effects, risk and benefits, and alternatives of medications discussed with patient.  Patient is aware that all medications have potential side effects and we are unable to predict every sideeffect or drug-drug interaction that may occur.  Expresses verbal understanding and consents to current therapy plan and treatment regiment.   Education and routine counseling performed. Handouts provided.  Anticipatory guidance and routine counseling done re: condition, txmnt options and need for follow up. All questions of patient's were answered.  Return if symptoms worsen or fail to improve.  Please see AVS handed out to patient at the end of our visit for additional patient instructions/ counseling done pertaining to today's office visit.  Note: This document was partially repared using Dragon voice recognition software and may include unintentional  dictation errors.  This document serves as a record of services personally performed by Thomasene Loteborah Karlea Mckibbin, DO. It was created on her behalf by Thelma BargeNick Cochran, a trained medical scribe. The creation of this record is based on the scribe's personal observations and the provider's statements to them.   I have reviewed the above medical documentation for accuracy and completeness and I concur.  Thomasene LotDeborah Stewart Sasaki 02/24/17 2:41 PM   Subjective:    Chief Complaint  Patient presents with  . Cough    cough productive at times, fatigue, bodyaches, low grade fevers x 1 wk     HPI:  Pt presents with Sx for 8 days.   C/o: tiredness, sore throat, constant, dry cough with burning chest pain when she coughs, wheezing, SOB, and low-grade fevers.    For symptoms patient has tried:  OTC medications with mild to no  relief.  Overall getting:   Worse  She has had strep throat before but she states this does not feel similarly. Pt was a smoker (for 10 years, 3-5 cigarettes per day) but quit 2 years ago. She also reports having a recent steroid injection in her ankle by her orthopedist. She states when she is prescribed abx, she always gets a yeast infection.    Patient Care Team    Relationship Specialty Notifications Start End  Thomasene Lotpalski, Yash Cacciola, DO PCP - General Family Medicine  07/12/15   Santiago GladFreeman, Marshall, MD Referring Physician Neurology  12/26/15    Comment: [facility]- headache medicine Board certified    Past medical history, Surgical history, Family history reviewed and noted below, Social history, Allergies, and Medications have been entered into the medical record, reviewed and changed as needed.   Allergies  Allergen Reactions  . Diclofenac     Pt stated, "Got a bad headache for 50 hours"  . Latex   . Other     mint    Review of Systems: - see above HPI for pertinent positives General:   No Chills, wt loss Pulm:   No DIB,  Card:  No CP, palpitations Abd:  No n/v/d or pain Ext:  No inc edema from baseline   Objective:   Blood pressure 126/84, pulse 79, temperature 99.2 F (37.3 C), height 5\' 4"  (1.626 m), weight 174 lb 11.2 oz (79.2 kg), last menstrual period 02/05/2017, SpO2 99 %. Body mass index is 29.99 kg/m. General: Well Developed, well nourished, appropriate for stated age.  Neuro: Alert and oriented x3, extra-ocular muscles intact, sensation grossly intact.  HEENT: Normocephalic, atraumatic, pupils equal round reactive to light, neck supple, no masses, no painful lymphadenopathy, TM's intact B/L, no acute findings. Nares- patent, clear d/c, OP- clear, mild erythema, No TTP sinuses.  Skin: Warm and dry, no gross rash. Cardiac: RRR, S1 S2,  no murmurs rubs or gallops.  Respiratory: ECTA B/L and A/P, Not using accessory muscles, speaking in full sentences-  unlabored. Vascular:  No gross lower ext edema, cap RF less 2 sec. Psych: No HI/SI, judgement and insight good, Euthymic mood. Full Affect.

## 2017-02-24 NOTE — Addendum Note (Signed)
Addended by: Carlye GrippePALSKI, Eugenia Eldredge J on: 02/24/2017 02:41 PM   Modules accepted: Orders

## 2017-02-24 NOTE — Addendum Note (Signed)
Addended by: Leda MinPULLIAM, MELISSA D on: 02/24/2017 10:38 AM   Modules accepted: Orders

## 2017-02-24 NOTE — Patient Instructions (Signed)
Upper respiratory viral infection, Adult Adenoviruses are common viruses that cause many different types of infections. The viruses usually affect the lungs, but they can also affect other parts of the body, including the eyes, stomach, bowels, bladder, and brain. The most common type of adenovirus infection is the common cold. Usually, adenovirus infections are not severe unless you have another health problem that makes it hard for your body to fight off infection. What are the causes? You can get this condition if you:  Touch a surface or object that has an adenovirus on it and then touch your mouth, nose, or eyes with unwashed hands.  Come into close physical contact with an infected person, such as by hugging or shaking hands.  Breathe in droplets that fly through the air when an infected person talks, coughs, or sneezes.  Have contact with infected stool.  Swim in a pool that does not have enough chlorine.  Adenoviruses can live outside the body for many weeks. They spread easily from person to person (are contagious). What increases the risk? This condition is more likely to develop in:  People who spend a lot of time in places where there are many people, such as schools, summer camps, daycare centers, community centers, and military recruit training centers.  Elderly adults.  People with a weak body defense system (immune system).  People with a lung disease.  People with a heart condition.  What are the signs or symptoms? Adenovirus infections usually cause flu-like symptoms. Once the virus gets into the body, symptoms of this condition can take up to 14 days to develop. Symptoms may include:  Headache.  Stiff neck.  Sleepiness or fatigue.  Confusion or disorientation.  Fever.  Sore throat.  Cough.  Trouble breathing.  Runny nose or congestion.  Pink eye (conjunctivitis).  Bleeding into the covering of the eye.  Stomachache or diarrhea.  Nausea or  vomiting.  Blood in the urine or pain while urinating.  Ear pain or fullness.  How is this diagnosed? This condition may be diagnosed based on your symptoms and a physical exam. Your health care provider may order tests to make sure your symptoms are not caused by another type of problem. Tests can include:  Blood tests.  Urine tests.  Stool tests.  Chest X-ray.  Tissue or throat culture.  How is this treated? This condition goes away on its own with time. Treatment for this condition involves managing symptoms until the condition goes away. Your health care provider may recommend:  Rest.  Drinking more fluids.  Taking over-the-counter medicine to help relieve a sore throat, fever, or headache.  Follow these instructions at home:  Rest at home until your symptoms go away.  Drink enough fluid to keep your urine clear or pale yellow.  Take over-the-counter and prescription medicines only as told by your health care provider.  Keep all follow-up visits as told by your health care provider. This is important. How is this prevented? Adenoviruses are resistant to many cleaning products and can remain on surfaces for long periods of time. To help prevent infection:  Wash your hands often with soap and water.  Cover your nose or mouth when you sneeze or cough.  Do not touch your eyes, nose, or mouth with unwashed hands.  Clean commonly used objects often.  Do not swim in a pool that is not properly chlorinated.  Avoid close contact with people who are sick.  Do not go to school or work when   you are sick.  Contact a health care provider if:  Your symptoms do not improve after 10 days.  Your symptoms get worse.  You cannot eat or drink without vomiting. Get help right away if:  You have trouble breathing or you are breathing rapidly.  Your skin, lips, or fingernails look blue (cyanosis).  You have a rapid heart rate.  You become confused.  You lose  consciousness. This information is not intended to replace advice given to you by your health care provider. Make sure you discuss any questions you have with your health care provider. Document Released: 03/23/2002 Document Revised: 08/28/2015 Document Reviewed: 08/28/2015 Elsevier Interactive Patient Education  2018 [employer] Inc[employer]   

## 2017-02-28 ENCOUNTER — Ambulatory Visit: Payer: BLUE CROSS/BLUE SHIELD

## 2017-02-28 ENCOUNTER — Ambulatory Visit: Payer: BLUE CROSS/BLUE SHIELD | Admitting: Family Medicine

## 2017-02-28 ENCOUNTER — Encounter: Payer: Self-pay | Admitting: Family Medicine

## 2017-02-28 ENCOUNTER — Telehealth: Payer: Self-pay

## 2017-02-28 VITALS — BP 122/84 | HR 107 | Ht 64.02 in | Wt 171.0 lb

## 2017-02-28 DIAGNOSIS — M533 Sacrococcygeal disorders, not elsewhere classified: Secondary | ICD-10-CM | POA: Diagnosis not present

## 2017-02-28 DIAGNOSIS — R05 Cough: Secondary | ICD-10-CM | POA: Diagnosis not present

## 2017-02-28 DIAGNOSIS — R059 Cough, unspecified: Secondary | ICD-10-CM

## 2017-02-28 MED ORDER — "RING CUSHION 16"" MISC": 0 refills | Status: DC
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
## 2017-02-28 NOTE — Telephone Encounter (Signed)
Patient called states that she went to medical supply office and none of the cushions worked for her, it was still too pain to sit. Patient was advised there that if the pain was that bad that she may want to call our office or go the the ED. I spoke with Dr. Sharee Holsterpalski and was instructed to tell the patient that there was nothing else we could do for her and that she advises for her to go to the ED or medCenter due to the fact that they can do additional imaging if needed that we can not do in the office.  Patient states that she already stopped at [facility] and they would not see her because she had already been seen by our office.  I explained to the patient that urgent cares are not able to do additional imaging but the ED and medCenters do have imaging services on site and can order same day exams.  Patient expressed understanding of this.  Per Dr. Sharee Holsterpalski referral for orthopedics was placed for patient. MPulliam, CMA/RT(R)

## 2017-02-28 NOTE — Patient Instructions (Addendum)
  If this pain continues despite conservative txmnt that we recommended, then let us know, we will send you ton orthopedist or sports med for further care     Tailbone Injury  The tailbone (coccyx) is the small bone at the lower end of the spine. A tailbone injury may involve stretched ligaments, bruising, or a broken bone (fracture). Tailbone injuries can be painful, and some may take a long time to heal. What are the causes? This condition is often caused by falling and landing on the tailbone. Other causes include:  Repeated strain or friction from actions such as rowing and bicycling.  Childbirth.  In some cases, the cause may not be known. What increases the risk? This condition is more common in women than in men. What are the signs or symptoms? Symptoms of this condition include:  Pain in the lower back, especially when sitting.  Pain or difficulty when standing up from a sitting position.  Bruising in the tailbone area.  Painful bowel movements.  In women, pain during intercourse.  How is this diagnosed? This condition may be diagnosed based on your symptoms and a physical exam. X-rays may be taken if a fracture is suspected. You may also have other tests, such as a CT scan or MRI. How is this treated? This condition may be treated with medicines to help relieve your pain. Most tailbone injuries heal on their own in 4-6 weeks. However, recovery time may be longer if the injury involves a fracture. Follow these instructions at home:  Take medicines only as directed by your health care provider.  If directed, apply ice to the injured area: ? Put ice in a plastic bag. ? Place a towel between your skin and the bag. ? Leave the ice on for 20 minutes, 2-3 times per day for the first 1-2 days.  Sit on a large, rubber or inflated ring or cushion to ease your pain. Lean forward when you are sitting to help decrease discomfort.  Avoid sitting for long periods of  time.  Increase your activity as the pain allows. Perform any exercises that are recommended by your health care provider or physical therapist.  If you have pain during bowel movements, use stool softeners as directed by your health care provider.  Eat a diet that includes plenty of fiber to help prevent constipation.  Keep all follow-up visits as directed by your health care provider. This is important. How is this prevented? Wear appropriate padding and sports gear when bicycling and rowing. This can help to prevent developing an injury that is caused by repeated strain or friction. Contact a health care provider if:  Your pain becomes worse.  Your bowel movements cause a great deal of discomfort.  You are unable to have a bowel movement.  You have uncontrolled urine loss (urinary incontinence).  You have a fever. This information is not intended to replace advice given to you by your health care provider. Make sure you discuss any questions you have with your health care provider. Document Released: 12/29/1999 Document Revised: 08/31/2015 Document Reviewed: 12/27/2013 Elsevier Interactive Patient Education  [employer]2018 Elsevier Inc.

## 2017-05-28 ENCOUNTER — Ambulatory Visit: Payer: BLUE CROSS/BLUE SHIELD | Admitting: Obstetrics and Gynecology

## 2017-06-12 ENCOUNTER — Telehealth (INDEPENDENT_AMBULATORY_CARE_PROVIDER_SITE_OTHER): Payer: Self-pay | Admitting: Orthopedic Surgery

## 2017-06-12 NOTE — Telephone Encounter (Signed)
Patient calling to schedule R ankle surgery.  I could not find a surgery sheet on this patient. She is also wanting to know the amount of time she'd be out of work.

## 2017-06-12 NOTE — Telephone Encounter (Signed)
If this is an ankle scope depending on healing could expect to be out of work 2-4 weeks depending on the activity level of her job.

## 2017-06-23 ENCOUNTER — Ambulatory Visit: Payer: BLUE CROSS/BLUE SHIELD | Admitting: Obstetrics and Gynecology

## 2017-07-01 ENCOUNTER — Telehealth (INDEPENDENT_AMBULATORY_CARE_PROVIDER_SITE_OTHER): Payer: Self-pay | Admitting: Orthopedic Surgery

## 2017-07-01 NOTE — Telephone Encounter (Signed)
[facility] [phone]

## 2017-07-04 ENCOUNTER — Other Ambulatory Visit (HOSPITAL_COMMUNITY)
Admission: RE | Admit: 2017-07-04 | Discharge: 2017-07-04 | Disposition: A | Discharge status: Home / Self Care | Payer: BLUE CROSS/BLUE SHIELD | Source: Ambulatory Visit | Attending: Obstetrics and Gynecology | Admitting: Obstetrics and Gynecology

## 2017-07-04 ENCOUNTER — Other Ambulatory Visit: Payer: Self-pay

## 2017-07-04 ENCOUNTER — Encounter: Payer: Self-pay | Admitting: Obstetrics and Gynecology

## 2017-07-04 ENCOUNTER — Ambulatory Visit (INDEPENDENT_AMBULATORY_CARE_PROVIDER_SITE_OTHER): Payer: BLUE CROSS/BLUE SHIELD | Admitting: Obstetrics and Gynecology

## 2017-07-04 VITALS — BP 121/83 | HR 96 | Ht 64.0 in | Wt 176.0 lb

## 2017-07-04 DIAGNOSIS — Z01419 Encounter for gynecological examination (general) (routine) without abnormal findings: Secondary | ICD-10-CM | POA: Diagnosis not present

## 2017-07-04 DIAGNOSIS — Z124 Encounter for screening for malignant neoplasm of cervix: Secondary | ICD-10-CM | POA: Diagnosis not present

## 2017-07-04 NOTE — Progress Notes (Signed)
32 y.o. G0P0000 MarriedCaucasianF here for annual exam.   Period Duration (Days): 7 days Period Pattern: Regular Menstrual Flow: Light, Moderate Menstrual Control: Panty liner, Thin pad Menstrual Control Change Freq (Hours): changes every 2 hours  The patient has issues with migraines without aura, see's a neurologist. She gets headaches and cramps with her cycle, can't function one day a month. Has had depression with OCP's in the past. Declines IUD. Using condoms.  Infrequent intercourse, it has always been painful. Not interested in penetration. Hasn't had penetration in years. No plans for children ever.   Patient's last menstrual period was 06/23/2017 (exact date).          Sexually active: Yes.    The current method of family planning is condoms always.    Exercising:   Not exercising regularly  Smoker:  no  Health Maintenance: Pap:  05-22-15 WNL NEG HR HPV, pap 05/22/2016 WNL History of abnormal Pap:  Yes years ago per patient TDaP:  11/10/2012 Gardasil: No, declines.    reports that she quit smoking about 3 years ago. Her smoking use included cigarettes. She has never used smokeless tobacco. She reports that she drinks alcohol. She reports that she does not use drugs. She works with data entry.   Past Medical History:  Diagnosis Date  . Condyloma   . Environmental allergies   . Generalized anxiety disorder 05/05/2015   05/11/2015 PHQ9 = 3, GAD7 = 18   . Genital warts   . Migraine 05/05/2015  . Migraine without aura   . Scoliosis     Past Surgical History:  Procedure Laterality Date  . CONDYLOMA EXCISION/FULGURATION  2009    Current Outpatient Medications  Medication Sig Dispense Refill  . naratriptan (AMERGE) 2.5 MG tablet   0  . tiZANidine (ZANAFLEX) 4 MG tablet Take 4 mg by mouth daily as needed.  1   No current facility-administered medications for this visit.     Family History  Problem Relation Age of Onset  . Alcohol abuse Mother   . Depression Mother   .  Hypertension Mother   . Diabetes Maternal Grandmother   . Allergic rhinitis Neg Hx   . Angioedema Neg Hx   . Asthma Neg Hx   . Atopy Neg Hx   . Eczema Neg Hx   . Immunodeficiency Neg Hx   . Urticaria Neg Hx     Review of Systems  Constitutional: Negative.   HENT: Negative.   Eyes: Negative.   Respiratory: Negative.   Cardiovascular: Negative.   Gastrointestinal: Negative.   Endocrine: Negative.   Genitourinary: Negative.   Musculoskeletal: Negative.   Skin: Negative.   Allergic/Immunologic: Negative.   Neurological: Negative.   Hematological: Negative.     Exam:   BP 121/83 (BP Location: Right Arm, Patient Position: Sitting, Cuff Size: Normal)   Pulse 96   Ht 5\' 4"  (1.626 m)   Wt 176 lb (79.8 kg)   LMP 06/23/2017 (Exact Date)   BMI 30.21 kg/m   Weight change: @WEIGHTCHANGE @ Height:   Height: 5\' 4"  (162.6 cm)  Ht Readings from Last 3 Encounters:  07/04/17 5\' 4"  (1.626 m)  02/28/17 5' 4.02" (1.626 m)  02/24/17 5\' 4"  (1.626 m)    General appearance: alert, cooperative and appears stated age Head: Normocephalic, without obvious abnormality, atraumatic Neck: no adenopathy, supple, symmetrical, trachea midline and thyroid normal to inspection and palpation Lungs: clear to auscultation bilaterally Cardiovascular: regular rate and rhythm Breasts: normal appearance, no masses or tenderness Abdomen:  soft, non-tender; non distended,  no masses,  no organomegaly Extremities: extremities normal, atraumatic, no cyanosis or edema Skin: Skin color, texture, turgor normal. No rashes or lesions Lymph nodes: Cervical, supraclavicular, and axillary nodes normal. No abnormal inguinal nodes palpated Neurologic: Grossly normal   Pelvic: External genitalia:  no lesions              Urethra:  normal appearing urethra with no masses, tenderness or lesions              Bartholins and Skenes: normal                 Vagina: normal appearing vagina with normal color and discharge, no  lesions              Cervix: no lesions and very uncomfortable with use of pediatric speculum and pap. No cervical motion tenderness.                Bimanual Exam:  Uterus:  normal size, contour, position, consistency, mobility, non-tender and anteverted              Adnexa: no mass, fullness, tenderness               Rectovaginal: declined  Chaperone was present for exam.  A:  Well Woman with normal exam  Dyspareunia, declines evaluation/treatment   P:   Labs with primary  Will use condoms if sexually active, discussed ella  Declines gardasil  Desires yearly pap and hpv  Discussed breast self exam  Discussed calcium and vit D intake

## 2017-07-04 NOTE — Patient Instructions (Signed)
EXERCISE AND DIET:  We recommended that you start or continue a regular exercise program for good health. Regular exercise means any activity that makes your heart beat faster and makes you sweat.  We recommend exercising at least 30 minutes per day at least 3 days a week, preferably 4 or 5.  We also recommend a diet low in fat and sugar.  Inactivity, poor dietary choices and obesity can cause diabetes, heart attack, stroke, and kidney damage, among others.    ALCOHOL AND SMOKING:  Women should limit their alcohol intake to no more than 7 drinks/beers/glasses of wine (combined, not each!) per week. Moderation of alcohol intake to this level decreases your risk of breast cancer and liver damage. And of course, no recreational drugs are part of a healthy lifestyle.  And absolutely no smoking or even second hand smoke. Most people know smoking can cause heart and lung diseases, but did you know it also contributes to weakening of your bones? Aging of your skin?  Yellowing of your teeth and nails?  CALCIUM AND VITAMIN D:  Adequate intake of calcium and Vitamin D are recommended.  The recommendations for exact amounts of these supplements seem to change often, but generally speaking 600 mg of calcium (either carbonate or citrate) and 800 units of Vitamin D per day seems prudent. Certain women may benefit from higher intake of Vitamin D.  If you are among these women, your doctor will have told you during your visit.    PAP SMEARS:  Pap smears, to check for cervical cancer or precancers,  have traditionally been done yearly, although recent scientific advances have shown that most women can have pap smears less often.  However, every woman still should have a physical exam from her gynecologist every year. It will include a breast check, inspection of the vulva and vagina to check for abnormal growths or skin changes, a visual exam of the cervix, and then an exam to evaluate the size and shape of the uterus and  ovaries.  And after 32 years of age, a rectal exam is indicated to check for rectal cancers. We will also provide age appropriate advice regarding health maintenance, like when you should have certain vaccines, screening for sexually transmitted diseases, bone density testing, colonoscopy, mammograms, etc.   MAMMOGRAMS:  All women over 40 years old should have a yearly mammogram. Many facilities now offer a "3D" mammogram, which may cost around $50 extra out of pocket. If possible,  we recommend you accept the option to have the 3D mammogram performed.  It both reduces the number of women who will be called back for extra views which then turn out to be normal, and it is better than the routine mammogram at detecting truly abnormal areas.    COLONOSCOPY:  Colonoscopy to screen for colon cancer is recommended for all women at age 50.  We know, you hate the idea of the prep.  We agree, BUT, having colon cancer and not knowing it is worse!!  Colon cancer so often starts as a polyp that can be seen and removed at colonscopy, which can quite literally save your life!  And if your first colonoscopy is normal and you have no family history of colon cancer, most women don't have to have it again for 10 years.  Once every ten years, you can do something that may end up saving your life, right?  We will be happy to help you get it scheduled when you are ready.    Be sure to check your insurance coverage so you understand how much it will cost.  It may be covered as a preventative service at no cost, but you should check your particular policy.      Breast Self-Awareness Breast self-awareness means being familiar with how your breasts look and feel. It involves checking your breasts regularly and reporting any changes to your health care provider. Practicing breast self-awareness is important. A change in your breasts can be a sign of a serious medical problem. Being familiar with how your breasts look and feel allows  you to find any problems early, when treatment is more likely to be successful. All women should practice breast self-awareness, including women who have had breast implants. How to do a breast self-exam One way to learn what is normal for your breasts and whether your breasts are changing is to do a breast self-exam. To do a breast self-exam: Look for Changes  1. Remove all the clothing above your waist. 2. Stand in front of a mirror in a room with good lighting. 3. Put your hands on your hips. 4. Push your hands firmly downward. 5. Compare your breasts in the mirror. Look for differences between them (asymmetry), such as: ? Differences in shape. ? Differences in size. ? Puckers, dips, and bumps in one breast and not the other. 6. Look at each breast for changes in your skin, such as: ? Redness. ? Scaly areas. 7. Look for changes in your nipples, such as: ? Discharge. ? Bleeding. ? Dimpling. ? Redness. ? A change in position. Feel for Changes  Carefully feel your breasts for lumps and changes. It is best to do this while lying on your back on the floor and again while sitting or standing in the shower or tub with soapy water on your skin. Feel each breast in the following way:  Place the arm on the side of the breast you are examining above your head.  Feel your breast with the other hand.  Start in the nipple area and make  inch (2 cm) overlapping circles to feel your breast. Use the pads of your three middle fingers to do this. Apply light pressure, then medium pressure, then firm pressure. The light pressure will allow you to feel the tissue closest to the skin. The medium pressure will allow you to feel the tissue that is a little deeper. The firm pressure will allow you to feel the tissue close to the ribs.  Continue the overlapping circles, moving downward over the breast until you feel your ribs below your breast.  Move one finger-width toward the center of the body.  Continue to use the  inch (2 cm) overlapping circles to feel your breast as you move slowly up toward your collarbone.  Continue the up and down exam using all three pressures until you reach your armpit.  Write Down What You Find  Write down what is normal for each breast and any changes that you find. Keep a written record with breast changes or normal findings for each breast. By writing this information down, you do not need to depend only on memory for size, tenderness, or location. Write down where you are in your menstrual cycle, if you are still menstruating. If you are having trouble noticing differences in your breasts, do not get discouraged. With time you will become more familiar with the variations in your breasts and more comfortable with the exam. How often should I examine my breasts? Examine   your breasts every month. If you are breastfeeding, the best time to examine your breasts is after a feeding or after using a breast pump. If you menstruate, the best time to examine your breasts is 5-7 days after your period is over. During your period, your breasts are lumpier, and it may be more difficult to notice changes. When should I see my health care provider? See your health care provider if you notice:  A change in shape or size of your breasts or nipples.  A change in the skin of your breast or nipples, such as a reddened or scaly area.  Unusual discharge from your nipples.  A lump or thick area that was not there before.  Pain in your breasts.  Anything that concerns you.  This information is not intended to replace advice given to you by your health care provider. Make sure you discuss any questions you have with your health care provider. Document Released: 12/31/2004 Document Revised: 06/08/2015 Document Reviewed: 11/20/2014 Elsevier Interactive Patient Education  2018 Elsevier Inc.  

## 2017-07-08 LAB — CYTOLOGY - PAP
DIAGNOSIS: NEGATIVE
HPV: NOT DETECTED

## 2017-08-19 ENCOUNTER — Telehealth (INDEPENDENT_AMBULATORY_CARE_PROVIDER_SITE_OTHER): Payer: Self-pay

## 2017-08-19 DIAGNOSIS — M19171 Post-traumatic osteoarthritis, right ankle and foot: Secondary | ICD-10-CM

## 2017-08-19 NOTE — Telephone Encounter (Signed)
I called and sw husband and he states pharm did not advise what the problem was and that they were supposed to call us. I called [facility] in [place] and they are closed until 2pm. I left a vm requesting a call back about this pt and will try them again after 2

## 2017-08-19 NOTE — Telephone Encounter (Signed)
Pt had surgery today and husband is trying to get pain med filled and pharmacy would not fill stating something was wrong with the way the Rx was written. Please call to discuss.

## 2017-08-19 NOTE — Telephone Encounter (Signed)
I called and sw pharm they needed to know if this was for a surgical procedure and if so what. This pt is s/p a right ankle scope called pt to advise that this is ready at the pharm.

## 2017-08-25 ENCOUNTER — Telehealth (INDEPENDENT_AMBULATORY_CARE_PROVIDER_SITE_OTHER): Payer: Self-pay | Admitting: Orthopedic Surgery

## 2017-08-25 NOTE — Telephone Encounter (Signed)
Called patient left message for return call.

## 2017-09-01 ENCOUNTER — Ambulatory Visit (INDEPENDENT_AMBULATORY_CARE_PROVIDER_SITE_OTHER): Payer: BLUE CROSS/BLUE SHIELD | Admitting: Orthopedic Surgery

## 2017-09-01 ENCOUNTER — Encounter (INDEPENDENT_AMBULATORY_CARE_PROVIDER_SITE_OTHER): Payer: Self-pay | Admitting: Orthopedic Surgery

## 2017-09-01 VITALS — Ht 64.0 in | Wt 176.0 lb

## 2017-09-01 DIAGNOSIS — M25871 Other specified joint disorders, right ankle and foot: Secondary | ICD-10-CM
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
## 2017-09-10 ENCOUNTER — Encounter: Payer: Self-pay | Admitting: Obstetrics and Gynecology

## 2017-09-11 ENCOUNTER — Telehealth: Payer: Self-pay | Admitting: Obstetrics and Gynecology

## 2017-09-11 NOTE — Telephone Encounter (Signed)
Yes, we treat condyloma either with removal or local treatment. Would recommend evaluation.

## 2017-09-11 NOTE — Telephone Encounter (Signed)
Dr. Oscar LaJertson -please advise on condyloma removal.   Last AEX 07/04/17.

## 2017-09-11 NOTE — Telephone Encounter (Signed)
Patient sent the following correspondence through MyChart. Routing to triage to assist patient with request.  Hello,    As you know, I have a history of hpv/genital warts. Recently two have developed. It's been years since this has occurred. Does this office offer removal?

## 2017-09-12 NOTE — Telephone Encounter (Signed)
Left message to call Omare Bilotta at [phone].  

## 2017-09-17 NOTE — Telephone Encounter (Signed)
Spoke with patient, advised as seen below per Dr. Oscar La. OV scheduled fr 9/23 at 4pm with Dr. Oscar La. Patient declined earlier appt, is s/p ankle surgery. Patient verbalizes understanding.   Routing to provider for final review. Patient is agreeable to disposition. Will close encounter.

## 2017-09-17 NOTE — Telephone Encounter (Signed)
Left message to call Sherlyne Crownover at [phone].  

## 2017-09-30 ENCOUNTER — Ambulatory Visit (INDEPENDENT_AMBULATORY_CARE_PROVIDER_SITE_OTHER): Payer: BLUE CROSS/BLUE SHIELD | Admitting: Physician Assistant

## 2017-09-30 ENCOUNTER — Encounter (INDEPENDENT_AMBULATORY_CARE_PROVIDER_SITE_OTHER): Payer: Self-pay | Admitting: Orthopedic Surgery

## 2017-09-30 DIAGNOSIS — M25871 Other specified joint disorders, right ankle and foot: Secondary | ICD-10-CM

## 2017-10-01 ENCOUNTER — Encounter (INDEPENDENT_AMBULATORY_CARE_PROVIDER_SITE_OTHER): Payer: Self-pay | Admitting: Physician Assistant
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
## 2017-10-06 ENCOUNTER — Ambulatory Visit: Payer: BLUE CROSS/BLUE SHIELD | Admitting: Obstetrics and Gynecology

## 2017-10-06 ENCOUNTER — Other Ambulatory Visit: Payer: Self-pay

## 2017-10-06 ENCOUNTER — Encounter: Payer: Self-pay | Admitting: Obstetrics and Gynecology

## 2017-10-06 VITALS — BP 138/82 | Ht 64.0 in | Wt 174.0 lb

## 2017-10-06 DIAGNOSIS — N9089 Other specified noninflammatory disorders of vulva and perineum: Secondary | ICD-10-CM | POA: Diagnosis not present
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
## 2017-10-06 NOTE — Patient Instructions (Signed)

## 2018-01-03 IMAGING — MR MR ANKLE*R* W/O CM
5 series · 37 of 40 positions shown · non-contrast
Comparison: None.

CLINICAL DATA: Right ankle pain and swelling after feeling a pop
when running 6 months ago. No previous relevant surgery.

EXAM:
MRI OF THE RIGHT ANKLE WITHOUT CONTRAST
TECHNIQUE: Multiplanar, multisequence MR imaging of the ankle was performed. No
intravenous contrast was administered.

[Series 4: T2 fat-sat · axial · 3.0mm · 0.53mm/px · z∈[-63,+58]mm · 8 of 32 slices shown (1 of 3)]
[im 1/32]
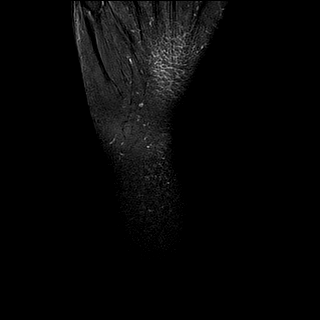
[im 5/32]
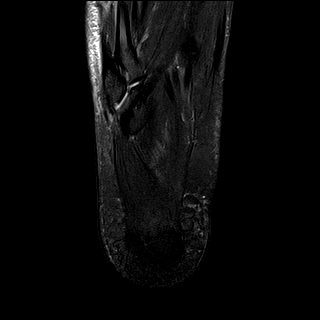
[im 9/32]
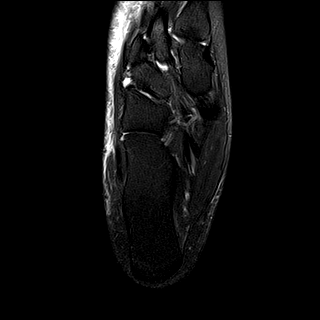
[im 14/32]
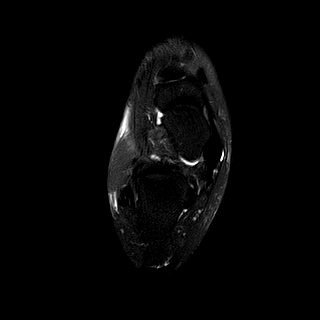
[im 18/32]
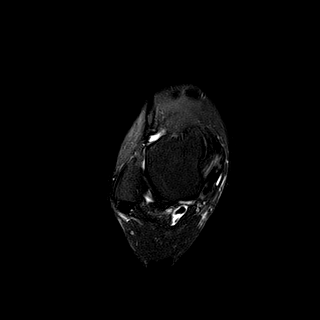
[im 23/32]
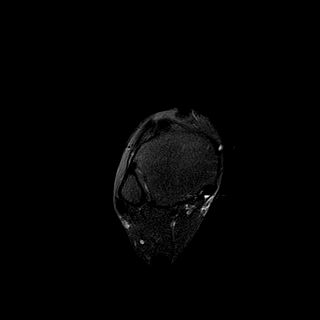
[im 27/32]
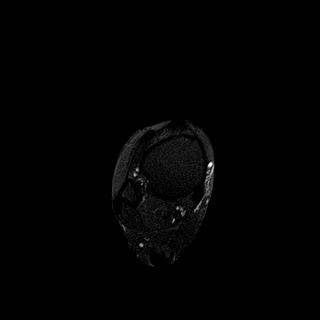
[im 32/32]
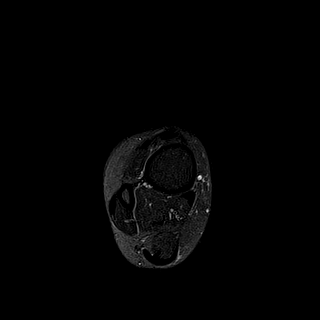

[Series 5: PD fat-sat · axial · 3.0mm · 0.44mm/px · z∈[-63,+58]mm · 9 of 32 slices shown]
[im 1/32]
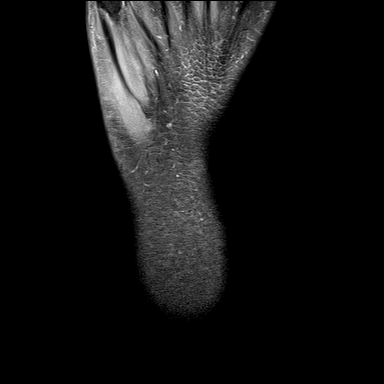
[im 4/32]
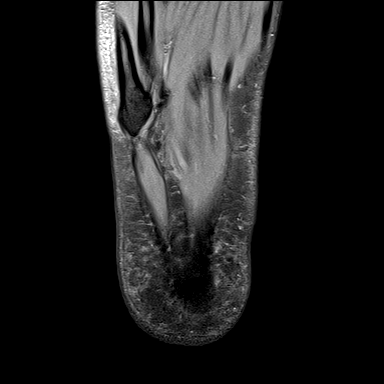
[im 8/32]
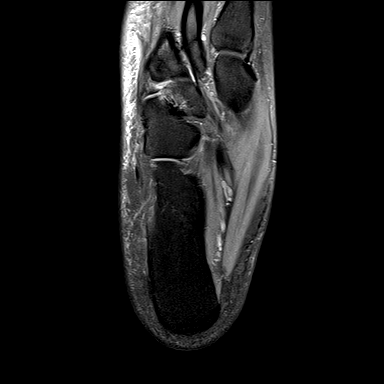
[im 12/32]
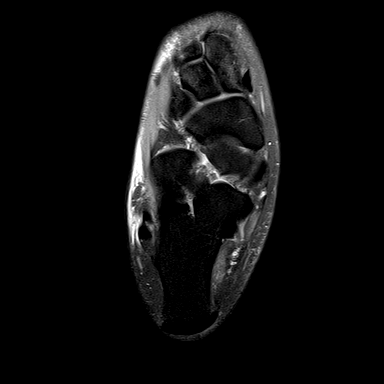
[im 16/32]
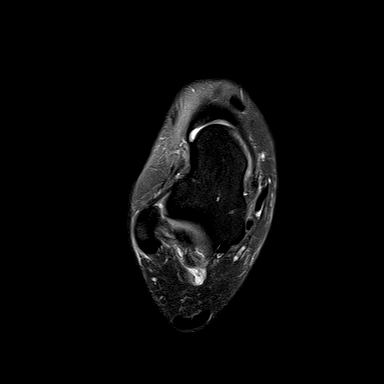
[im 20/32]
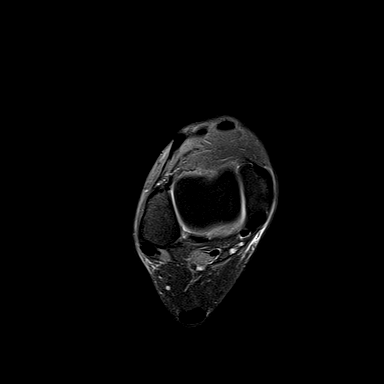
[im 24/32]
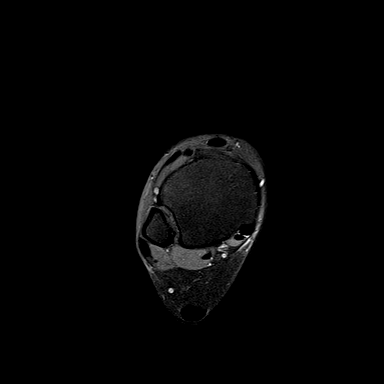
[im 28/32]
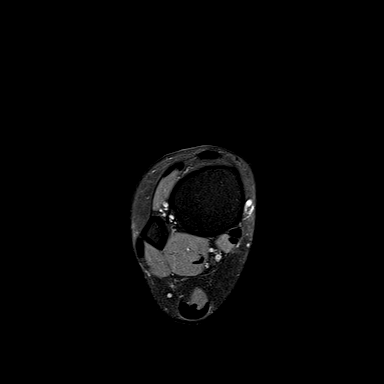
[im 32/32]
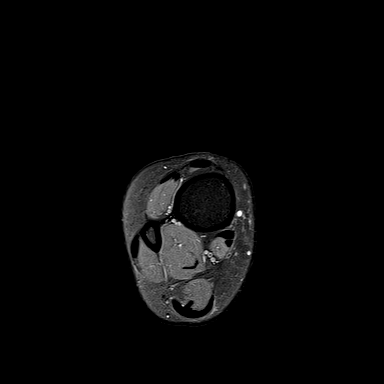

[Series 6: T1 · sagittal · 3.0mm · 0.40mm/px · 4 of 24 slices shown]
[im 1/24]
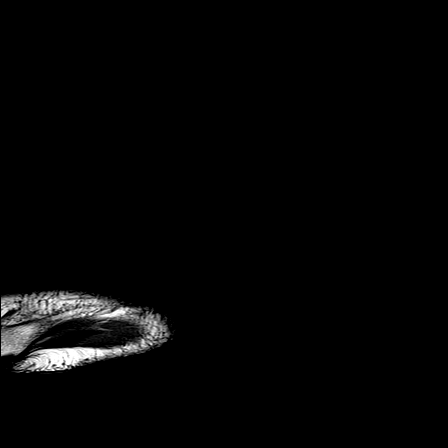
[im 4/24]
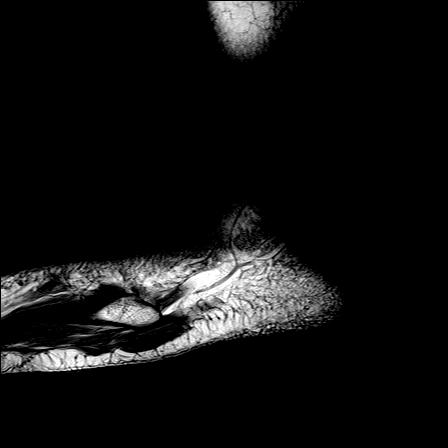
[im 8/24]
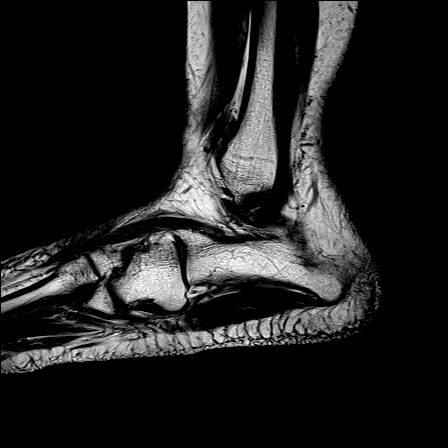
[im 12/24]
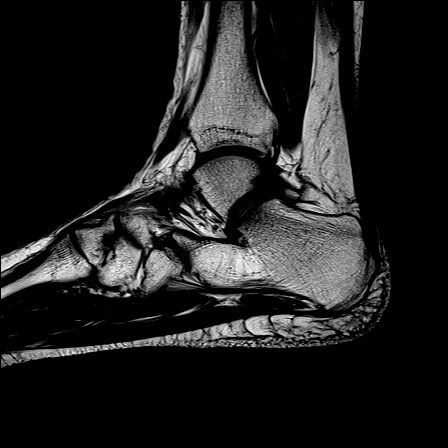

[Series 7: T2 fat-sat · sagittal · 3.0mm · 0.56mm/px · 7 of 24 slices shown (2 of 3)]
[im 1/24]
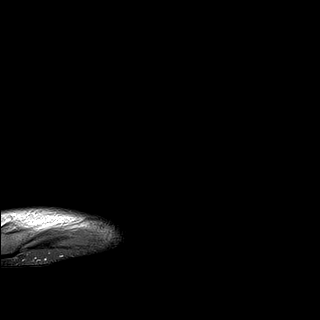
[im 4/24]
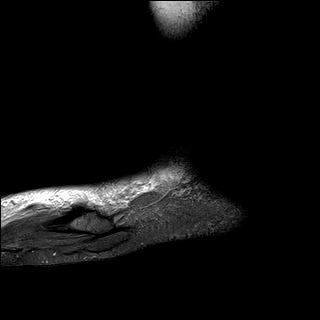
[im 8/24]
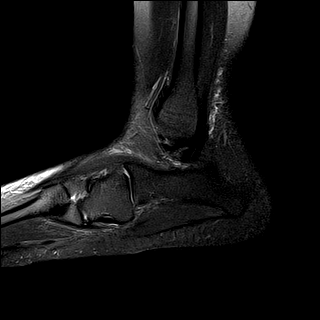
[im 12/24]
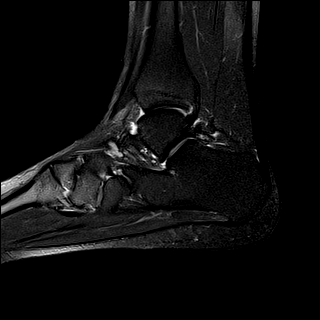
[im 16/24]
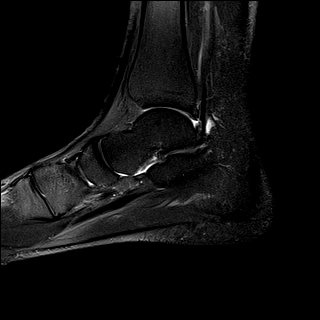
[im 20/24]
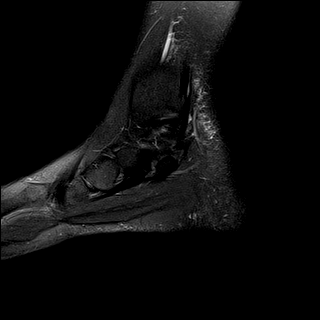
[im 24/24]
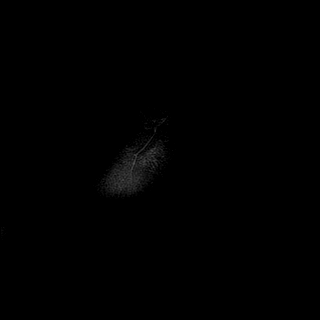

[Series 8: T2 fat-sat · coronal · 3.5mm · 0.47mm/px · 9 of 32 slices shown (3 of 3)]
[im 1/32]
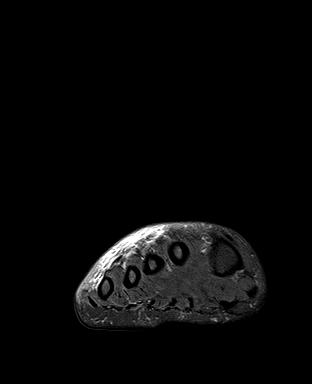
[im 4/32]
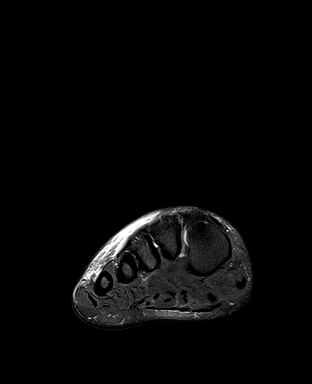
[im 8/32]
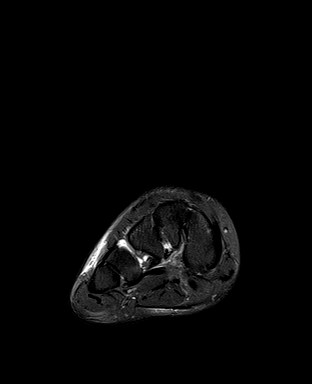
[im 12/32]
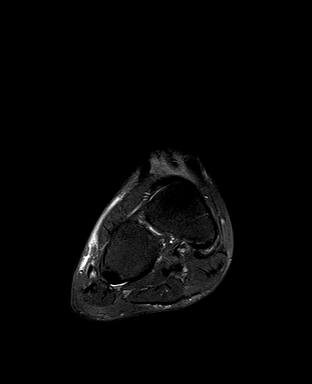
[im 16/32]
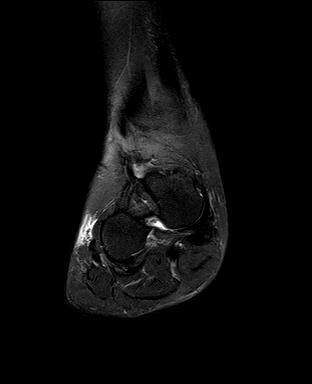
[im 20/32]
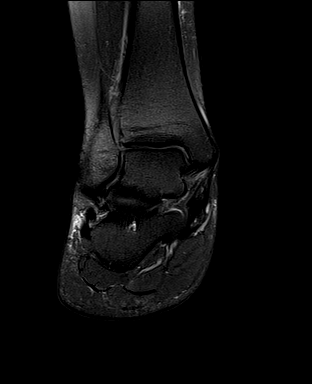
[im 24/32]
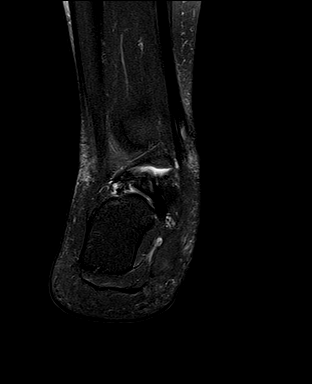
[im 28/32]
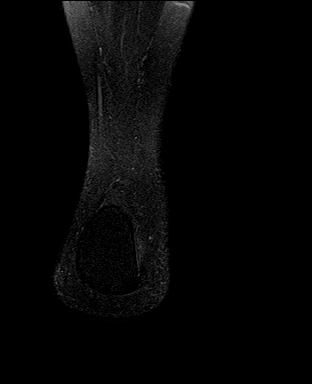
[im 32/32]
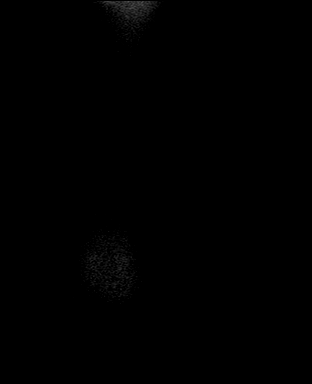

[37 of 40 positions shown; findings below may reference images not displayed]

FINDINGS: TENDONS

Peroneal: Intact and normally positioned.

Posteromedial: Intact and normally positioned.

Anterior: Intact and normally positioned.

Achilles: Intact.

Plantar Fascia: Intact.

LIGAMENTS

Lateral: The anterior and posterior talofibular and calcaneofibular
ligaments are intact.

Medial: The deltoid and visualized portions of the spring ligament
appear intact.

CARTILAGE AND BONES

Ankle Joint: No significant ankle joint effusion. The talar dome and
tibial plafond are intact.

Subtalar Joints/Sinus Tarsi: The orientation of the posterior
subtalar joint on the coronal images suggests possible mild hindfoot
valgus deformity. However, no definite signs of extra-articular
lateral hindfoot impingement.

Bones: No significant extra-articular osseous findings. Os trigonum
noted without abnormal signal.

Other: Mild nonspecific subcutaneous edema extending over the dorsal
lateral aspect of the midfoot and forefoot. No focal fluid
collections are seen.
IMPRESSION: 1. The ankle tendons and ligaments appear intact.
2. Suggested mild hindfoot valgus deformity without definite signs
of extra-articular lateral hindfoot impingement.
3. Mild nonspecific subcutaneous edema in the dorsal lateral aspect
of the midfoot and forefoot.

## 2018-07-09 ENCOUNTER — Other Ambulatory Visit: Payer: Self-pay

## 2018-07-13 ENCOUNTER — Other Ambulatory Visit (HOSPITAL_COMMUNITY)
Admission: RE | Admit: 2018-07-13 | Discharge: 2018-07-13 | Disposition: A | Discharge status: Home / Self Care | Payer: BC Managed Care – PPO | Source: Ambulatory Visit | Attending: Obstetrics and Gynecology | Admitting: Obstetrics and Gynecology

## 2018-07-13 ENCOUNTER — Ambulatory Visit: Payer: BC Managed Care – PPO | Admitting: Obstetrics and Gynecology

## 2018-07-13 ENCOUNTER — Other Ambulatory Visit: Payer: Self-pay

## 2018-07-13 ENCOUNTER — Encounter: Payer: Self-pay | Admitting: Obstetrics and Gynecology

## 2018-07-13 VITALS — BP 126/78 | HR 76 | Temp 98.6°F | Ht 64.57 in | Wt 170.0 lb

## 2018-07-13 DIAGNOSIS — Z113 Encounter for screening for infections with a predominantly sexual mode of transmission: Secondary | ICD-10-CM | POA: Diagnosis not present

## 2018-07-13 DIAGNOSIS — Z23 Encounter for immunization: Secondary | ICD-10-CM | POA: Diagnosis not present

## 2018-07-13 DIAGNOSIS — Z7185 Encounter for immunization safety counseling: Secondary | ICD-10-CM

## 2018-07-13 DIAGNOSIS — Z7189 Other specified counseling: Secondary | ICD-10-CM

## 2018-07-13 DIAGNOSIS — Z124 Encounter for screening for malignant neoplasm of cervix: Secondary | ICD-10-CM | POA: Diagnosis not present

## 2018-07-13 DIAGNOSIS — Z0000 Encounter for general adult medical examination without abnormal findings: Secondary | ICD-10-CM

## 2018-07-13 DIAGNOSIS — N9089 Other specified noninflammatory disorders of vulva and perineum: Secondary | ICD-10-CM

## 2018-07-13 DIAGNOSIS — Z01419 Encounter for gynecological examination (general) (routine) without abnormal findings: Secondary | ICD-10-CM

## 2018-07-13 NOTE — Progress Notes (Signed)
33 y.o. G0P0000 Legally Separated White or Caucasian Not Hispanic or Latino female here for annual exam.  Would like STD testing. In the process of divorce. Seeing a Veterinary surgeoncounselor. Good support.   Not currently sexually active. Has always had dyspareunia.   The patient has a h/o severe genital condyloma, treated multiple times. Last removed in the fall of 2019.   H/O migraines without aura, see's a neurologist. They are currently better, with a decrease in her stress.   Period Cycle (Days): 28 Period Duration (Days): 5 days Period Pattern: Regular Menstrual Flow: Light, Moderate Menstrual Control: Thin pad Menstrual Control Change Freq (Hours): changes pad every 4-6 hours Dysmenorrhea: (!) Moderate Dysmenorrhea Symptoms: Cramping She gets headaches and cramps with her cycle. Hasn't tolerated OCP's, declines IUD.   Patient's last menstrual period was 07/12/2018 (exact date).          Sexually active: No.  The current method of family planning is none.    Exercising: Yes.    walking Smoker:  no  Health Maintenance: Pap: 07/04/2017 WNL NEG HPV, 05-22-15 WNL NEG HR HPV, pap 05/22/2016 WNL History of abnormal Pap:  Yes years ago per patient TDaP:  11/10/2012 Gardasil: No   reports that she quit smoking about 4 years ago. Her smoking use included cigarettes. She has never used smokeless tobacco. She reports current alcohol use. She reports that she does not use drugs. Couple of glasses of wine a week. She works in data entry.   Past Medical History:  Diagnosis Date  . Condyloma   . Environmental allergies   . Generalized anxiety disorder 05/05/2015   05/11/2015 PHQ9 = 3, GAD7 = 18   . Genital warts   . Migraine 05/05/2015  . Migraine without aura   . Scoliosis     Past Surgical History:  Procedure Laterality Date  . CONDYLOMA EXCISION/FULGURATION  2009    Current Outpatient Medications  Medication Sig Dispense Refill  . naratriptan (AMERGE) 2.5 MG tablet   0  . tiZANidine  (ZANAFLEX) 4 MG tablet Take 4 mg by mouth daily as needed.  1   No current facility-administered medications for this visit.     Family History  Problem Relation Age of Onset  . Alcohol abuse Mother   . Depression Mother   . Hypertension Mother   . Diabetes Maternal Grandmother   . Allergic rhinitis Neg Hx   . Angioedema Neg Hx   . Asthma Neg Hx   . Atopy Neg Hx   . Eczema Neg Hx   . Immunodeficiency Neg Hx   . Urticaria Neg Hx     Review of Systems  Constitutional: Negative.   HENT: Negative.   Eyes: Negative.   Respiratory: Negative.   Cardiovascular: Negative.   Gastrointestinal: Negative.   Endocrine: Negative.   Genitourinary: Negative.   Musculoskeletal: Negative.   Skin: Negative.   Allergic/Immunologic: Negative.   Neurological: Negative.   Hematological: Negative.   Psychiatric/Behavioral: Negative.     Exam:   BP 126/78 (BP Location: Right Arm, Patient Position: Sitting, Cuff Size: Normal)   Pulse 76   Temp 98.6 F (37 C) (Skin)   Ht 5' 4.57" (1.64 m)   Wt 170 lb (77.1 kg)   LMP 07/12/2018 (Exact Date)   BMI 28.67 kg/m   Weight change: @WEIGHTCHANGE @ Height:   Height: 5' 4.57" (164 cm)  Ht Readings from Last 3 Encounters:  07/13/18 5' 4.57" (1.64 m)  10/06/17 5\' 4"  (1.626 m)  09/01/17 5\' 4"  (1.626  m)    General appearance: alert, cooperative and appears stated age Head: Normocephalic, without obvious abnormality, atraumatic Neck: no adenopathy, supple, symmetrical, trachea midline and thyroid normal to inspection and palpation Lungs: clear to auscultation bilaterally Cardiovascular: regular rate and rhythm Breasts: normal appearance, no masses or tenderness Abdomen: soft, non-tender; non distended,  no masses,  no organomegaly Extremities: extremities normal, atraumatic, no cyanosis or edema Skin: Skin color, texture, turgor normal. No rashes or lesions Lymph nodes: Cervical, supraclavicular, and axillary nodes normal. No abnormal inguinal  nodes palpated Neurologic: Grossly normal   Pelvic: External genitalia:  Flat 4 mm lesion between the right labia minora and majora, turns white with application of acetic acid.               Urethra:  normal appearing urethra with no masses, tenderness or lesions              Bartholins and Skenes: normal                 Vagina: normal appearing vagina with normal color and discharge, no lesions              Cervix: no lesions               Bimanual Exam:  Uterus:  normal size, contour, position, consistency, mobility, non-tender              Adnexa: no mass, fullness, tenderness               Rectovaginal: Confirms               Anus:  normal sphincter tone, no lesions  Chaperone was present for exam.  A:  Well Woman with normal exam  H/O dyspareunia  Vulvar lesion, suspicious for condyloma. H/O recurrent condyloma  P:   Start gardasil series  Screening for STD  Screening labs  She will let me know if she wants to work on the dyspareunia.   Discussed breast self exam  Discussed calcium and vit D intake  Discussed options of treatment, she desires removal.

## 2018-07-13 NOTE — Patient Instructions (Signed)
EXERCISE AND DIET:  We recommended that you start or continue a regular exercise program for good health. Regular exercise means any activity that makes your heart beat faster and makes you sweat.  We recommend exercising at least 30 minutes per day at least 3 days a week, preferably 4 or 5.  We also recommend a diet low in fat and sugar.  Inactivity, poor dietary choices and obesity can cause diabetes, heart attack, stroke, and kidney damage, among others.    ALCOHOL AND SMOKING:  Women should limit their alcohol intake to no more than 7 drinks/beers/glasses of wine (combined, not each!) per week. Moderation of alcohol intake to this level decreases your risk of breast cancer and liver damage. And of course, no recreational drugs are part of a healthy lifestyle.  And absolutely no smoking or even second hand smoke. Most people know smoking can cause heart and lung diseases, but did you know it also contributes to weakening of your bones? Aging of your skin?  Yellowing of your teeth and nails?  CALCIUM AND VITAMIN D:  Adequate intake of calcium and Vitamin D are recommended.  The recommendations for exact amounts of these supplements seem to change often, but generally speaking 1,000 mg of calcium (between diet and supplement) and 800 units of Vitamin D per day seems prudent. Certain women may benefit from higher intake of Vitamin D.  If you are among these women, your doctor will have told you during your visit.    PAP SMEARS:  Pap smears, to check for cervical cancer or precancers,  have traditionally been done yearly, although recent scientific advances have shown that most women can have pap smears less often.  However, every woman still should have a physical exam from her gynecologist every year. It will include a breast check, inspection of the vulva and vagina to check for abnormal growths or skin changes, a visual exam of the cervix, and then an exam to evaluate the size and shape of the uterus and  ovaries.  And after 33 years of age, a rectal exam is indicated to check for rectal cancers. We will also provide age appropriate advice regarding health maintenance, like when you should have certain vaccines, screening for sexually transmitted diseases, bone density testing, colonoscopy, mammograms, etc.   MAMMOGRAMS:  All women over 40 years old should have a yearly mammogram. Many facilities now offer a "3D" mammogram, which may cost around $50 extra out of pocket. If possible,  we recommend you accept the option to have the 3D mammogram performed.  It both reduces the number of women who will be called back for extra views which then turn out to be normal, and it is better than the routine mammogram at detecting truly abnormal areas.    COLON CANCER SCREENING: Now recommend starting at age 45. At this time colonoscopy is not covered for routine screening until 50. There are take home tests that can be done between 45-49.   COLONOSCOPY:  Colonoscopy to screen for colon cancer is recommended for all women at age 50.  We know, you hate the idea of the prep.  We agree, BUT, having colon cancer and not knowing it is worse!!  Colon cancer so often starts as a polyp that can be seen and removed at colonscopy, which can quite literally save your life!  And if your first colonoscopy is normal and you have no family history of colon cancer, most women don't have to have it again for   10 years.  Once every ten years, you can do something that may end up saving your life, right?  We will be happy to help you get it scheduled when you are ready.  Be sure to check your insurance coverage so you understand how much it will cost.  It may be covered as a preventative service at no cost, but you should check your particular policy.      Breast Self-Awareness Breast self-awareness means being familiar with how your breasts look and feel. It involves checking your breasts regularly and reporting any changes to your  health care provider. Practicing breast self-awareness is important. A change in your breasts can be a sign of a serious medical problem. Being familiar with how your breasts look and feel allows you to find any problems early, when treatment is more likely to be successful. All women should practice breast self-awareness, including women who have had breast implants. How to do a breast self-exam One way to learn what is normal for your breasts and whether your breasts are changing is to do a breast self-exam. To do a breast self-exam: Look for Changes  1. Remove all the clothing above your waist. 2. Stand in front of a mirror in a room with good lighting. 3. Put your hands on your hips. 4. Push your hands firmly downward. 5. Compare your breasts in the mirror. Look for differences between them (asymmetry), such as: ? Differences in shape. ? Differences in size. ? Puckers, dips, and bumps in one breast and not the other. 6. Look at each breast for changes in your skin, such as: ? Redness. ? Scaly areas. 7. Look for changes in your nipples, such as: ? Discharge. ? Bleeding. ? Dimpling. ? Redness. ? A change in position. Feel for Changes Carefully feel your breasts for lumps and changes. It is best to do this while lying on your back on the floor and again while sitting or standing in the shower or tub with soapy water on your skin. Feel each breast in the following way:  Place the arm on the side of the breast you are examining above your head.  Feel your breast with the other hand.  Start in the nipple area and make  inch (2 cm) overlapping circles to feel your breast. Use the pads of your three middle fingers to do this. Apply light pressure, then medium pressure, then firm pressure. The light pressure will allow you to feel the tissue closest to the skin. The medium pressure will allow you to feel the tissue that is a little deeper. The firm pressure will allow you to feel the tissue  close to the ribs.  Continue the overlapping circles, moving downward over the breast until you feel your ribs below your breast.  Move one finger-width toward the center of the body. Continue to use the  inch (2 cm) overlapping circles to feel your breast as you move slowly up toward your collarbone.  Continue the up and down exam using all three pressures until you reach your armpit.  Write Down What You Find  Write down what is normal for each breast and any changes that you find. Keep a written record with breast changes or normal findings for each breast. By writing this information down, you do not need to depend only on memory for size, tenderness, or location. Write down where you are in your menstrual cycle, if you are still menstruating. If you are having trouble noticing differences   in your breasts, do not get discouraged. With time you will become more familiar with the variations in your breasts and more comfortable with the exam. How often should I examine my breasts? Examine your breasts every month. If you are breastfeeding, the best time to examine your breasts is after a feeding or after using a breast pump. If you menstruate, the best time to examine your breasts is 5-7 days after your period is over. During your period, your breasts are lumpier, and it may be more difficult to notice changes. When should I see my health care provider? See your health care provider if you notice:  A change in shape or size of your breasts or nipples.  A change in the skin of your breast or nipples, such as a reddened or scaly area.  Unusual discharge from your nipples.  A lump or thick area that was not there before.  Pain in your breasts.  Anything that concerns you.  

## 2018-07-14 ENCOUNTER — Encounter: Payer: Self-pay | Admitting: Obstetrics and Gynecology

## 2018-07-14 ENCOUNTER — Telehealth: Payer: Self-pay | Admitting: Obstetrics and Gynecology

## 2018-07-14 LAB — LIPID PANEL
Chol/HDL Ratio: 3.6 ratio (ref 0.0–4.4)
Cholesterol, Total: 160 mg/dL (ref 100–199)
HDL: 44 mg/dL (ref >39)
LDL Calculated: 96 mg/dL (ref 0–99)
Triglycerides: 101 mg/dL (ref 0–149)
VLDL Cholesterol Cal: 20 mg/dL (ref 5–40)

## 2018-07-14 LAB — COMPREHENSIVE METABOLIC PANEL
ALT: 8 IU/L (ref 0–32)
AST: 11 IU/L (ref 0–40)
Albumin/Globulin Ratio: 1.6 (ref 1.2–2.2)
Albumin: 4.4 g/dL (ref 3.8–4.8)
Alkaline Phosphatase: 73 IU/L (ref 39–117)
BUN/Creatinine Ratio: 9 (ref 9–23)
BUN: 7 mg/dL (ref 6–20)
Bilirubin Total: 0.2 mg/dL (ref 0.0–1.2)
CO2: 23 mmol/L (ref 20–29)
Calcium: 9 mg/dL (ref 8.7–10.2)
Chloride: 105 mmol/L (ref 96–106)
Creatinine, Ser: 0.8 mg/dL (ref 0.57–1.00)
GFR calc Af Amer: 112 mL/min/{1.73_m2} (ref >59)
GFR calc non Af Amer: 97 mL/min/{1.73_m2} (ref >59)
Globulin, Total: 2.7 g/dL (ref 1.5–4.5)
Glucose: 87 mg/dL (ref 65–99)
Potassium: 3.9 mmol/L (ref 3.5–5.2)
Sodium: 139 mmol/L (ref 134–144)
Total Protein: 7.1 g/dL (ref 6.0–8.5)

## 2018-07-14 LAB — CBC
Hematocrit: 39.9 % (ref 34.0–46.6)
Hemoglobin: 13.2 g/dL (ref 11.1–15.9)
MCH: 28.9 pg (ref 26.6–33.0)
MCHC: 33.1 g/dL (ref 31.5–35.7)
MCV: 88 fL (ref 79–97)
Platelets: 285 10*3/uL (ref 150–450)
RBC: 4.56 x10E6/uL (ref 3.77–5.28)
RDW: 12.4 % (ref 11.7–15.4)
WBC: 5.5 10*3/uL (ref 3.4–10.8)

## 2018-07-14 LAB — HEPATITIS C ANTIBODY: Hep C Virus Ab: <0.1 s/co ratio (ref 0.0–0.9)

## 2018-07-14 LAB — HEP, RPR, HIV PANEL
HIV Screen 4th Generation wRfx: NONREACTIVE
Hepatitis B Surface Ag: NEGATIVE
RPR Ser Ql: NONREACTIVE

## 2018-07-14 NOTE — Telephone Encounter (Signed)
Patient sent the following message through [facility]. Routing to triage to assist patient with request.  I need to schedule an appointment for an excision. I have tried calling several times but the line keeps ringing and then goes to silence. Please call me? [phone]. Thank you.

## 2018-07-14 NOTE — Telephone Encounter (Signed)
Seen in office on 6/29 for AEX, requesting to schedule vulvar biopsy. Procedure scheduled for 7/6 at 4:30pm with Dr. Talbert Nan, patient declined earlier appt offered for 7/2 at 4:30pm. Advised to take Motrin 800 mg with food and water one hour before procedure.  Routing to provider for final review. Patient is agreeable to disposition. Will close encounter.  Cc: Lerry Liner, Magdalene Patricia

## 2018-07-15 ENCOUNTER — Telehealth: Payer: Self-pay | Admitting: Obstetrics and Gynecology

## 2018-07-15 LAB — CHLAMYDIA/GONOCOCCUS/TRICHOMONAS, NAA
Chlamydia by NAA: NEGATIVE
Gonococcus by NAA: NEGATIVE
Trich vag by NAA: NEGATIVE

## 2018-07-15 NOTE — Telephone Encounter (Signed)
Spoke with patient and conveyed benefits for vulva biopsy. Patient understands/agreeable with benefits. Appointment scheduled 07/20/18

## 2018-07-16 ENCOUNTER — Telehealth: Payer: Self-pay

## 2018-07-16 LAB — CYTOLOGY - PAP: HPV: NOT DETECTED

## 2018-07-16 NOTE — Telephone Encounter (Signed)
-----   Message from Salvadore Dom, MD sent at 07/16/2018  3:00 PM EDT ----- Can you please add on hpv testing, let her know and set her up for a colposcopy

## 2018-07-16 NOTE — Telephone Encounter (Signed)
Spoke with patient. Advised patient of abnormal pap results. Patient verbalizes understanding. Patient is coming in for a vulvar biopsy on 07/20/2018 and would like to schedule her colposcopy then. Patient is aware HPV testing has been added through [facility] and she will be contacted when the results return. Patient is agreeable. Encounter closed.

## 2018-07-20 ENCOUNTER — Other Ambulatory Visit: Payer: Self-pay

## 2018-07-20 ENCOUNTER — Encounter: Payer: Self-pay | Admitting: Obstetrics and Gynecology

## 2018-07-20 ENCOUNTER — Ambulatory Visit: Payer: BC Managed Care – PPO | Admitting: Obstetrics and Gynecology

## 2018-07-20 VITALS — BP 124/84 | HR 88 | Temp 98.1°F | Wt 168.0 lb

## 2018-07-20 DIAGNOSIS — N9089 Other specified noninflammatory disorders of vulva and perineum: Secondary | ICD-10-CM | POA: Diagnosis not present

## 2018-07-20 NOTE — Patient Instructions (Signed)
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
## 2018-07-22 ENCOUNTER — Telehealth: Payer: Self-pay | Admitting: Obstetrics and Gynecology
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
## 2018-07-22 NOTE — Telephone Encounter (Signed)
Spoke with patient and conveyed benefits for colposcopy. Patient understands/agreeable with the benefits. Patient is aware of cancellation policy.

## 2018-07-23 ENCOUNTER — Ambulatory Visit: Payer: BC Managed Care – PPO | Admitting: Obstetrics and Gynecology

## 2018-07-23 ENCOUNTER — Other Ambulatory Visit: Payer: Self-pay

## 2018-07-23 ENCOUNTER — Encounter: Payer: Self-pay | Admitting: Obstetrics and Gynecology

## 2018-07-23 VITALS — BP 108/76 | HR 88 | Temp 98.2°F | Wt 168.0 lb

## 2018-07-23 DIAGNOSIS — R87611 Atypical squamous cells cannot exclude high grade squamous intraepithelial lesion on cytologic smear of cervix (ASC-H): Secondary | ICD-10-CM

## 2018-07-23 NOTE — Patient Instructions (Signed)

## 2018-07-23 NOTE — Addendum Note (Signed)
Addended by: Antonius Hartlage E on: 07/23/2018 05:25 PM   Modules accepted: Orders  

## 2018-07-30 ENCOUNTER — Telehealth: Payer: Self-pay

## 2018-07-30 DIAGNOSIS — R87611 Atypical squamous cells cannot exclude high grade squamous intraepithelial lesion on cytologic smear of cervix (ASC-H): Secondary | ICD-10-CM

## 2018-07-30 NOTE — Telephone Encounter (Signed)
Spoke with patient. Results given. Patient verbalizes understanding. Patient is asking if another pap smear can be performed prior to having a LEEP done? Patient is questioning results of the pap.

## 2018-07-30 NOTE — Telephone Encounter (Signed)
-----   Message from Salvadore Dom, MD sent at 07/28/2018 12:49 PM EDT ----- Please let the patient know that her biopsies are negative. Unfortunately this doesn't explain her pap results (ASC-H), because her colposcopy was unsatisfactory (I couldn't see the entire transformation zone where the dysplasia is), she should have a leep. She had a hard time with the LEEP, I had offered her LEEP in the OR, but she previously declined. This is an option.

## 2018-07-30 NOTE — Telephone Encounter (Signed)
Spoke with patient. Message given. Patient verbalizes understanding. LEEP scheduled for 08/10/2018 at 4:30 pm with Dr.Jertson. Patient is agreeable to date and time. Patient is abstaining.  Instructions given. Motrin 800 mg po x , one hour before appointment with food. Make sure to eat a meal before appointment and drink plenty of fluids. Patient verbalized understanding and will call to reschedule if will be on menses or has any concerns regarding pregnancy. Patient agreeable and verbalized understanding of all instructions. Order placed.   Routing to provider and will close encounter.

## 2018-07-30 NOTE — Telephone Encounter (Signed)
I wouldn't recommend that we disregard the ASC-H pap no matter what a repeat pap returned as. Risk of missing HSIL.

## 2018-07-31 ENCOUNTER — Telehealth: Payer: Self-pay | Admitting: Obstetrics and Gynecology

## 2018-07-31 NOTE — Telephone Encounter (Signed)
Spoke with patient and conveyed benefits for LEEP. Patient understands/agreeable with benefits. Patient is aware of the cancellation policy. Appointment scheduled 08/10/18.
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
## 2018-08-06 ENCOUNTER — Other Ambulatory Visit: Payer: Self-pay

## 2018-08-07 ENCOUNTER — Telehealth: Payer: Self-pay | Admitting: Obstetrics and Gynecology

## 2018-08-07 NOTE — Telephone Encounter (Signed)
Patient has LEEP procedure scheduled for 08/10/18 and she has started her cycle. Her roommate is also out-of-state ([place]) and scheduled to arrive back this weekend. She would like to know if this will affect anything?

## 2018-08-07 NOTE — Telephone Encounter (Signed)
Return call to patient.  Menses just starting and usually lasts 1 week. LEEP rescheduled to 08-17-18 at 1130.  Roommate has traveled out of state and will be back home. Advised if patient or roommate develop Covid 19 symptoms, or are undergoing testing based on exposure, she should call back to discuss rescheduling.   Routing to Dr Talbert Nan. Encounter closed.

## 2018-08-10 ENCOUNTER — Ambulatory Visit: Payer: BC Managed Care – PPO | Admitting: Obstetrics and Gynecology
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
## 2018-08-17 ENCOUNTER — Encounter: Payer: Self-pay | Admitting: Obstetrics and Gynecology

## 2018-08-17 ENCOUNTER — Other Ambulatory Visit: Payer: Self-pay

## 2018-08-17 ENCOUNTER — Ambulatory Visit: Payer: BC Managed Care – PPO | Admitting: Obstetrics and Gynecology

## 2018-08-17 ENCOUNTER — Other Ambulatory Visit: Payer: Self-pay | Admitting: Obstetrics and Gynecology

## 2018-08-17 DIAGNOSIS — R87611 Atypical squamous cells cannot exclude high grade squamous intraepithelial lesion on cytologic smear of cervix (ASC-H): Secondary | ICD-10-CM

## 2018-08-17 NOTE — Patient Instructions (Signed)
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
## 2018-09-14 ENCOUNTER — Other Ambulatory Visit: Payer: Self-pay

## 2018-09-14 ENCOUNTER — Ambulatory Visit: Payer: BC Managed Care – PPO

## 2018-09-16 ENCOUNTER — Encounter: Payer: Self-pay | Admitting: Obstetrics and Gynecology

## 2018-09-16 ENCOUNTER — Ambulatory Visit: Payer: BC Managed Care – PPO | Admitting: Obstetrics and Gynecology

## 2018-09-16 ENCOUNTER — Other Ambulatory Visit: Payer: Self-pay

## 2018-09-16 VITALS — BP 118/78 | HR 80 | Temp 97.2°F | Wt 164.0 lb

## 2018-09-16 DIAGNOSIS — Z9889 Other specified postprocedural states: Secondary | ICD-10-CM

## 2018-09-16 DIAGNOSIS — Z23 Encounter for immunization: Secondary | ICD-10-CM

## 2018-09-16 DIAGNOSIS — Z8619 Personal history of other infectious and parasitic diseases: Secondary | ICD-10-CM | POA: Diagnosis not present

## 2018-10-27 ENCOUNTER — Encounter: Payer: Self-pay | Admitting: Obstetrics and Gynecology

## 2018-10-27 ENCOUNTER — Telehealth: Payer: Self-pay | Admitting: Obstetrics and Gynecology

## 2018-10-27 NOTE — Telephone Encounter (Signed)
Patient sent the following message through [facility]. Routing to triage to assist patient with request.  Brittney Estrada "Brittney Estrada [facility]  Phone Number: [phone]        I read that you can have DNA test to find out which strain of HPV you have. Is that something I could have done? I would really like to know this. Thank you!

## 2018-10-27 NOTE — Telephone Encounter (Signed)
Results and any recommendations were sent via MyChart.  

## 2018-10-27 NOTE — Telephone Encounter (Signed)
Routing to Dr. Talbert Nan to review MyChart message and advise.

## 2019-01-19 ENCOUNTER — Ambulatory Visit: Payer: BC Managed Care – PPO

## 2019-01-19 NOTE — Progress Notes (Deleted)
Patient in today for 3rd and final  Gardasil injection.   Contraception: *** LMP: *** Last AEX: 07/13/2018 with Jertson  Injection given in ***. Patient tolerated shot well.    Routed to provider for final review.  Encounter closed.

## 2019-01-23 ENCOUNTER — Encounter: Payer: Self-pay | Admitting: Obstetrics and Gynecology

## 2019-01-25 ENCOUNTER — Telehealth: Payer: Self-pay | Admitting: Obstetrics and Gynecology

## 2019-01-25 NOTE — Telephone Encounter (Signed)
Patient sent the following correspondence through MyChart.  My work has me scheduled to get my first COVID 19 vaccine shot this Monday. On the intake questionnaire it asked if I had, had any other vaccines within the last four weeks. With my final gardasil shot scheduled just a few days after, I wanted to make sure it's ok to get my gardasil shot? I didn't anticipate that being a question and just wanted to make sure. I'm not sure what it's in reference to.

## 2019-01-25 NOTE — Telephone Encounter (Signed)
Per review of UpTo date:    Do not administer simultaneously with other vaccines; allow a minimum interval of 14 days before or after other vaccines. If administered within 14 days of another vaccine, doses of either vaccine do not need to be repeated (CDC 2020).    Dr. Oscar La -please review, any additional recommendations?

## 2019-01-25 NOTE — Telephone Encounter (Signed)
It is probably fine to get the vaccinations close together since neither is a live vaccine. Since the covid is a new vaccine and there is no rush to get the gardasil, I would just push the gardasil out until she is 2-4 weeks s/p her second covid vaccination (as per the recommendation that you found).

## 2019-01-26 NOTE — Progress Notes (Deleted)
Patient in today for 3rd and final Gardasil injection.   Contraception: *** LMP: *** Last AEX: 07/13/2018 with JJ  Injection given in ***. Patient tolerated shot well.    Routed to provider for final review.  Encounter closed.

## 2019-01-26 NOTE — Telephone Encounter (Signed)
Spoke with patient. Advised per Dr. Oscar La. Patient was unable to get Covid19 vaccine on 01/25/19, would like to keep Nurse visit as scheduled for 01/27/19 for Gardasil vaccine. Patient states she will know more info today from employer regarding Covid 19 vaccine availability, if she needs to reschedule Gardasil she will return call to office to reschedule. Patient thankful for call.   Routing to provider for final review. Patient is agreeable to disposition. Will close encounter.

## 2019-01-27 ENCOUNTER — Ambulatory Visit: Payer: BC Managed Care – PPO

## 2019-02-22 ENCOUNTER — Encounter: Payer: Self-pay | Admitting: Obstetrics and Gynecology

## 2019-03-01 ENCOUNTER — Encounter: Payer: Self-pay | Admitting: Obstetrics and Gynecology

## 2019-03-01 ENCOUNTER — Telehealth: Payer: Self-pay | Admitting: Obstetrics and Gynecology

## 2019-03-01 ENCOUNTER — Ambulatory Visit (INDEPENDENT_AMBULATORY_CARE_PROVIDER_SITE_OTHER): Payer: BC Managed Care – PPO | Admitting: Obstetrics and Gynecology

## 2019-03-01 ENCOUNTER — Other Ambulatory Visit (HOSPITAL_COMMUNITY)
Admission: RE | Admit: 2019-03-01 | Discharge: 2019-03-01 | Disposition: A | Discharge status: Home / Self Care | Payer: BC Managed Care – PPO | Source: Ambulatory Visit | Attending: Obstetrics and Gynecology | Admitting: Obstetrics and Gynecology

## 2019-03-01 ENCOUNTER — Other Ambulatory Visit: Payer: Self-pay

## 2019-03-01 VITALS — BP 134/62 | HR 89 | Temp 98.1°F | Ht 65.0 in | Wt 173.0 lb

## 2019-03-01 DIAGNOSIS — Z87410 Personal history of cervical dysplasia: Secondary | ICD-10-CM | POA: Diagnosis present

## 2019-03-01 DIAGNOSIS — Z113 Encounter for screening for infections with a predominantly sexual mode of transmission: Secondary | ICD-10-CM | POA: Diagnosis present

## 2019-03-01 DIAGNOSIS — Z124 Encounter for screening for malignant neoplasm of cervix: Secondary | ICD-10-CM | POA: Insufficient documentation

## 2019-03-01 DIAGNOSIS — Z3009 Encounter for other general counseling and advice on contraception: Secondary | ICD-10-CM | POA: Diagnosis not present

## 2019-03-01 MED ORDER — ELLA 30 MG PO TABS: 1.0000 | ORAL_TABLET | Freq: Once | ORAL | 0 refills | Status: AC

## 2019-03-01 NOTE — Telephone Encounter (Signed)
Patient is due for final gardasil. Just received second covid vaccine and would like to know if she should wait for the 3rd gardasil or OK to go ahead and schedule?

## 2019-03-01 NOTE — Telephone Encounter (Signed)
Left message for pt to call back to triage RN Judeth Cornfield to schedule 3rd Gardasil vaccination.
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
## 2019-03-01 NOTE — Patient Instructions (Signed)
Emergency Contraception Emergency contraception refers to birth control methods that prevent pregnancy after unprotected sex. Emergency contraception may be recommended:  When a condom breaks.  After a sexual assault.  If you forgot to take your birth control pill.  If you used inadequate contraception during sex. Emergency contraception is most effective if used as soon as possible after sex. It is important to note that it does not protect against STIs (sexually transmitted infections). Do not use emergency contraception as your only form of birth control. Types of emergency contraceptives Emergency contraception must be used within 5 days after having unprotected sex. The following types of emergency contraception are available:  Hormonal pills that work by preventing the ovaries from releasing an egg (ovulation) and preventing fertilization of an egg. There are two types of hormonal pills: ? A pill that contains high doses of estrogen and progesterone. ? A pill that contains only progesterone. This may be a single pill or two pills taken 12-24 hours apart.  A non-hormonal pill that works by preventing progesterone from having its normal effect on ovulation and the lining of the uterus (endometrium). A prescription for this medicine is usually required.  A non-hormonal medical device that is inserted into the uterus (copper intrauterine device, IUD). The copper in the IUD causes the sperm to be less able to fertilize the egg. A health care provider must insert the IUD. Most types of emergency contraceptives are available without a prescription or a visit with your health care provider. If you are younger than 17, you may need a prescription. Talk with your pharmacist about your options. Side effects Ask your health care provider about the possible side effects of emergency contraceptives. Side effects may include:  Abdominal pain and cramps.  Nausea and vomiting.  Breast  tenderness.  Headache.  Dizziness.  Fatigue.  Irregular bleeding or spotting. If you take emergency contraception while you are pregnant, it will not end your pregnancy or harm your baby. Follow these instructions at home:  Take over-the-counter and prescription medicines only as told by your health care provider.  Eat something before taking the emergency contraception pills. This can help prevent nausea.  If you feel tired or dizzy, rest until you feel better.  Continue using your normal method of birth control. Contact a health care provider if:  You vomit within 2 hours after taking a pill. You may need to take another pill.  You have a severe headache.  You have bleeding that does not stop.  It has been 21 days since you took an emergency contraception pill and you have not had a menstrual period. Get help right away if:  You have chest pain.  You have leg pain.  You have numbness or weakness of your arms or legs.  You have slurred speech.  You have visual problems. Summary  Emergency contraceptives prevent pregnancy after unprotected sex.  Emergency contraception will not work if you are already pregnant and will not harm the baby if you are pregnant.  Some emergency contraceptives are available from your local pharmacy without a prescription.  Talk to your health care provider about the type of emergency contraceptives that are best for you. This information is not intended to replace advice given to you by your health care provider. Make sure you discuss any questions you have with your health care provider. Document Revised: 12/13/2016 Document Reviewed: 02/13/2016 Elsevier Patient Education  2020 [employer]  

## 2019-03-02 LAB — HEPATITIS C ANTIBODY: Hep C Virus Ab: <0.1 s/co ratio (ref 0.0–0.9)

## 2019-03-02 LAB — HEP, RPR, HIV PANEL
HIV Screen 4th Generation wRfx: NONREACTIVE
Hepatitis B Surface Ag: NEGATIVE
RPR Ser Ql: NONREACTIVE

## 2019-03-03 LAB — CYTOLOGY - PAP
Adequacy: ABSENT
Chlamydia: NEGATIVE
Comment: NEGATIVE
Comment: NEGATIVE
Comment: NEGATIVE
Comment: NORMAL
Diagnosis: NEGATIVE
High risk HPV: NEGATIVE
Neisseria Gonorrhea: NEGATIVE
Trichomonas: NEGATIVE

## 2019-03-10 NOTE — Telephone Encounter (Signed)
Spoke to pt. Pt scheduled for 3rd Gardasil on 04/08/2019 at 3:45 pm per pts request. Pt agreeable.   Encounter closed.

## 2019-04-02 ENCOUNTER — Encounter: Payer: Self-pay | Admitting: Certified Nurse Midwife

## 2019-04-08 ENCOUNTER — Ambulatory Visit: Payer: BC Managed Care – PPO

## 2019-04-14 ENCOUNTER — Other Ambulatory Visit: Payer: Self-pay

## 2019-04-15 ENCOUNTER — Ambulatory Visit (INDEPENDENT_AMBULATORY_CARE_PROVIDER_SITE_OTHER): Payer: BC Managed Care – PPO

## 2019-04-15 VITALS — BP 122/70 | HR 84 | Temp 97.9°F | Resp 10 | Ht 65.0 in | Wt 171.0 lb

## 2019-04-15 DIAGNOSIS — Z23 Encounter for immunization: Secondary | ICD-10-CM | POA: Diagnosis not present

## 2019-04-15 NOTE — Progress Notes (Signed)
Patient in today for 3rd Gardasil injection.   Contraception: Condoms LMP: 03/30/19 Last AEX: 07/13/18 with Dr. Oscar La  Injection given in Right Deltoid. Patient tolerated shot well.   Patient has completed the HPV vaccination series.   Routed to provider for final review.  Encounter closed.

## 2019-05-20 ENCOUNTER — Telehealth: Payer: Self-pay

## 2019-05-20 ENCOUNTER — Encounter: Payer: Self-pay | Admitting: Obstetrics and Gynecology

## 2019-05-20 NOTE — Telephone Encounter (Signed)
Routing MyChart message to Dr. Oscar La.

## 2019-05-20 NOTE — Telephone Encounter (Signed)
Brittney Estrada "Brittney Estrada"  [facility]  Phone Number: [phone]  Hi,  I wanted to reach out for help in answering a question. Research on the internet has not been helpful.   I have a latex allergy and I am struggling to find a non-latex unlubricated condom. Are you aware of any at all? Apparently FC2 was recently discontinued and that was the only option I have even heard rumors about. I am trying to determine the absolute safest way to have oral sex. So if you have any education on other non-latex barriers that could be used, please let me know.   Thank you so much for your help!

## 2019-05-20 NOTE — Telephone Encounter (Signed)
mychart message sent

## 2019-06-29 ENCOUNTER — Other Ambulatory Visit: Payer: Self-pay

## 2019-06-29 ENCOUNTER — Telehealth: Payer: Self-pay

## 2019-06-29 ENCOUNTER — Encounter: Payer: Self-pay | Admitting: Obstetrics and Gynecology

## 2019-06-29 ENCOUNTER — Ambulatory Visit: Payer: BC Managed Care – PPO | Admitting: Obstetrics and Gynecology

## 2019-06-29 VITALS — BP 120/64 | HR 98 | Temp 98.2°F | Ht 65.0 in | Wt 173.0 lb

## 2019-06-29 DIAGNOSIS — R3915 Urgency of urination: Secondary | ICD-10-CM

## 2019-06-29 DIAGNOSIS — N760 Acute vaginitis: Secondary | ICD-10-CM

## 2019-06-29 DIAGNOSIS — R300 Dysuria: Secondary | ICD-10-CM | POA: Diagnosis not present

## 2019-06-29 DIAGNOSIS — Z113 Encounter for screening for infections with a predominantly sexual mode of transmission: Secondary | ICD-10-CM

## 2019-06-29 LAB — POCT URINALYSIS DIPSTICK
Bilirubin, UA: NEGATIVE
Glucose, UA: NEGATIVE
Ketones, UA: NEGATIVE
Nitrite, UA: NEGATIVE
Protein, UA: NEGATIVE
Urobilinogen, UA: NEGATIVE E.U./dL — AB
pH, UA: 7 (ref 5.0–8.0)

## 2019-06-29 MED ORDER — BETAMETHASONE VALERATE 0.1 % EX OINT: 1.0000 "application " | TOPICAL_OINTMENT | Freq: Two times a day (BID) | CUTANEOUS | 0 refills | Status: DC
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
## 2019-06-29 NOTE — Telephone Encounter (Signed)
Spoke with patient. Patient requesting OV for urinary symptoms. Reports urinary urgency and  "feeling achy" when voiding. Symptoms started on 06/18/19. Not resolving, patient is concerned. Denies vaginal odor, d/c, hematuria, flank pain, fever/chills, N/V. LMP last week, unsure of exact date.   Last AEX 07/13/18  Covid 19 prescreen negative, precautions reviewed. OV scheduled for today at 3:15pm with Dr. Oscar La.   Routing to provider for final review. Patient is agreeable to disposition. Will close encounter.

## 2019-06-29 NOTE — Patient Instructions (Signed)
Urinary Tract Infection, Adult  A urinary tract infection (UTI) is an infection of any part of the urinary tract. The urinary tract includes the kidneys, ureters, bladder, and urethra. These organs make, store, and get rid of urine in the body. Your health care provider may use other names to describe the infection. An upper UTI affects the ureters and kidneys (pyelonephritis). A lower UTI affects the bladder (cystitis) and urethra (urethritis). What are the causes? Most urinary tract infections are caused by bacteria in your genital area, around the entrance to your urinary tract (urethra). These bacteria grow and cause inflammation of your urinary tract. What increases the risk? You are more likely to develop this condition if:  You have a urinary catheter that stays in place (indwelling).  You are not able to control when you urinate or have a bowel movement (you have incontinence).  You are female and you: ? Use a spermicide or diaphragm for birth control. ? Have low estrogen levels. ? Are pregnant.  You have certain genes that increase your risk (genetics).  You are sexually active.  You take antibiotic medicines.  You have a condition that causes your flow of urine to slow down, such as: ? An enlarged prostate, if you are female. ? Blockage in your urethra (stricture). ? A kidney stone. ? A nerve condition that affects your bladder control (neurogenic bladder). ? Not getting enough to drink, or not urinating often.  You have certain medical conditions, such as: ? Diabetes. ? A weak disease-fighting system (immunesystem). ? Sickle cell disease. ? Gout. ? Spinal cord injury. What are the signs or symptoms? Symptoms of this condition include:  Needing to urinate right away (urgently).  Frequent urination or passing small amounts of urine frequently.  Pain or burning with urination.  Blood in the urine.  Urine that smells bad or unusual.  Trouble urinating.  Cloudy  urine.  Vaginal discharge, if you are female.  Pain in the abdomen or the lower back. You may also have:  Vomiting or a decreased appetite.  Confusion.  Irritability or tiredness.  A fever.  Diarrhea. The first symptom in older adults may be confusion. In some cases, they may not have any symptoms until the infection has worsened. How is this diagnosed? This condition is diagnosed based on your medical history and a physical exam. You may also have other tests, including:  Urine tests.  Blood tests.  Tests for sexually transmitted infections (STIs). If you have had more than one UTI, a cystoscopy or imaging studies may be done to determine the cause of the infections. How is this treated? Treatment for this condition includes:  Antibiotic medicine.  Over-the-counter medicines to treat discomfort.  Drinking enough water to stay hydrated. If you have frequent infections or have other conditions such as a kidney stone, you may need to see a health care provider who specializes in the urinary tract (urologist). In rare cases, urinary tract infections can cause sepsis. Sepsis is a life-threatening condition that occurs when the body responds to an infection. Sepsis is treated in the hospital with IV antibiotics, fluids, and other medicines. Follow these instructions at home:  Medicines  Take over-the-counter and prescription medicines only as told by your health care provider.  If you were prescribed an antibiotic medicine, take it as told by your health care provider. Do not stop using the antibiotic even if you start to feel better. General instructions  Make sure you: ? Empty your bladder often and   completely. Do not hold urine for long periods of time. ? Empty your bladder after sex. ? Wipe from front to back after a bowel movement if you are female. Use each tissue one time when you wipe.  Drink enough fluid to keep your urine pale yellow.  Keep all follow-up  visits as told by your health care provider. This is important. Contact a health care provider if:  Your symptoms do not get better after 1-2 days.  Your symptoms go away and then return. Get help right away if you have:  Severe pain in your back or your lower abdomen.  A fever.  Nausea or vomiting. Summary  A urinary tract infection (UTI) is an infection of any part of the urinary tract, which includes the kidneys, ureters, bladder, and urethra.  Most urinary tract infections are caused by bacteria in your genital area, around the entrance to your urinary tract (urethra).  Treatment for this condition often includes antibiotic medicines.  If you were prescribed an antibiotic medicine, take it as told by your health care provider. Do not stop using the antibiotic even if you start to feel better.  Keep all follow-up visits as told by your health care provider. This is important. This information is not intended to replace advice given to you by your health care provider. Make sure you discuss any questions you have with your health care provider. Document Revised: 12/18/2017 Document Reviewed: 07/10/2017 Elsevier Patient Education  2020 [employer] Vaginitis Vaginitis is a condition in which the vaginal tissue swells and becomes red (inflamed). This condition is most often caused by a change in the normal balance of bacteria and yeast that live in the vagina. This change causes an overgrowth of certain bacteria or yeast, which causes the inflammation. There are different types of vaginitis, but the most common types are:  Bacterial vaginosis.  Yeast infection (candidiasis).  Trichomoniasis vaginitis. This is a sexually transmitted disease (STD).  Viral vaginitis.  Atrophic vaginitis.  Allergic vaginitis. What are the causes? The cause of this condition depends on the type of vaginitis. It can be caused by:  Bacteria (bacterial vaginosis).  Yeast, which is a fungus  (yeast infection).  A parasite (trichomoniasis vaginitis).  A virus (viral vaginitis).  Low hormone levels (atrophic vaginitis). Low hormone levels can occur during pregnancy, breastfeeding, or after menopause.  Irritants, such as bubble baths, scented tampons, and feminine sprays (allergic vaginitis). Other factors can change the normal balance of the yeast and bacteria that live in the vagina. These include:  Antibiotic medicines.  Poor hygiene.  Diaphragms, vaginal sponges, spermicides, birth control pills, and intrauterine devices (IUD).  Sex.  Infection.  Uncontrolled diabetes.  A weakened defense (immune) system. What increases the risk? This condition is more likely to develop in women who:  Smoke.  Use vaginal douches, scented tampons, or scented sanitary pads.  Wear tight-fitting pants.  Wear thong underwear.  Use oral birth control pills or an IUD.  Have sex without a condom.  Have multiple sex partners.  Have an STD.  Frequently use the spermicide nonoxynol-9.  Eat lots of foods high in sugar.  Have uncontrolled diabetes.  Have low estrogen levels.  Have a weakened immune system from an immune disorder or medical treatment.  Are pregnant or breastfeeding. What are the signs or symptoms? Symptoms vary depending on the cause of the vaginitis. Common symptoms include:  Abnormal vaginal discharge. ? The discharge is white, gray, or yellow with bacterial vaginosis. ? The   discharge is thick, white, and cheesy with a yeast infection. ? The discharge is frothy and yellow or greenish with trichomoniasis.  A bad vaginal smell. The smell is fishy with bacterial vaginosis.  Vaginal itching, pain, or swelling.  Sex that is painful.  Pain or burning when urinating. Sometimes there are no symptoms. How is this diagnosed? This condition is diagnosed based on your symptoms and medical history. A physical exam, including a pelvic exam, will also be  done. You may also have other tests, including:  Tests to determine the pH level (acidity or alkalinity) of your vagina.  A whiff test, to assess the odor that results when a sample of your vaginal discharge is mixed with a potassium hydroxide solution.  Tests of vaginal fluid. A sample will be examined under a microscope. How is this treated? Treatment varies depending on the type of vaginitis you have. Your treatment may include:  Antibiotic creams or pills to treat bacterial vaginosis and trichomoniasis.  Antifungal medicines, such as vaginal creams or suppositories, to treat a yeast infection.  Medicine to ease discomfort if you have viral vaginitis. Your sexual partner should also be treated.  Estrogen delivered in a cream, pill, suppository, or vaginal ring to treat atrophic vaginitis. If vaginal dryness occurs, lubricants and moisturizing creams may help. You may need to avoid scented soaps, sprays, or douches.  Stopping use of a product that is causing allergic vaginitis. Then using a vaginal cream to treat the symptoms. Follow these instructions at home: Lifestyle  Keep your genital area clean and dry. Avoid soap, and only rinse the area with water.  Do not douche or use tampons until your health care provider says it is okay to do so. Use sanitary pads, if needed.  Do not have sex until your health care provider approves. When you can return to sex, practice safe sex and use condoms.  Wipe from front to back. This avoids the spread of bacteria from the rectum to the vagina. General instructions  Take over-the-counter and prescription medicines only as told by your health care provider.  If you were prescribed an antibiotic medicine, take or use it as told by your health care provider. Do not stop taking or using the antibiotic even if you start to feel better.  Keep all follow-up visits as told by your health care provider. This is important. How is this  prevented?  Use mild, non-scented products. Do not use things that can irritate the vagina, such as fabric softeners. Avoid the following products if they are scented: ? Feminine sprays. ? Detergents. ? Tampons. ? Feminine hygiene products. ? Soaps or bubble baths.  Let air reach your genital area. ? Wear cotton underwear to reduce moisture buildup. ? Avoid wearing underwear while you sleep. ? Avoid wearing tight pants and underwear or nylons without a cotton panel. ? Avoid wearing thong underwear.  Take off any wet clothing, such as bathing suits, as soon as possible.  Practice safe sex and use condoms. Contact a health care provider if:  You have abdominal pain.  You have a fever.  You have symptoms that last for more than 2-3 days. Get help right away if:  You have a fever and your symptoms suddenly get worse. Summary  Vaginitis is a condition in which the vaginal tissue becomes inflamed.This condition is most often caused by a change in the normal balance of bacteria and yeast that live in the vagina.  Treatment varies depending on the type of   vaginitis you have.  Do not douche, use tampons , or have sex until your health care provider approves. When you can return to sex, practice safe sex and use condoms. This information is not intended to replace advice given to you by your health care provider. Make sure you discuss any questions you have with your health care provider. Document Revised: 12/13/2016 Document Reviewed: 02/06/2016 Elsevier Patient Education  2020 Elsevier Inc.  

## 2019-06-29 NOTE — Addendum Note (Signed)
Addended by: Blima Ledger on: 06/29/2019 05:17 PM   Modules accepted: Orders

## 2019-06-29 NOTE — Telephone Encounter (Signed)
Patient is calling in regards to a UTI. Need triage to assist in scheduling.

## 2019-06-30 ENCOUNTER — Ambulatory Visit (INDEPENDENT_AMBULATORY_CARE_PROVIDER_SITE_OTHER): Payer: BC Managed Care – PPO | Admitting: Obstetrics and Gynecology

## 2019-06-30 VITALS — BP 120/76 | HR 86 | Temp 97.7°F | Resp 14 | Ht 65.0 in | Wt 174.4 lb

## 2019-06-30 DIAGNOSIS — R3915 Urgency of urination: Secondary | ICD-10-CM | POA: Diagnosis not present

## 2019-06-30 DIAGNOSIS — R102 Pelvic and perineal pain: Secondary | ICD-10-CM

## 2019-06-30 LAB — HEPATITIS C ANTIBODY: Hep C Virus Ab: <0.1 s/co ratio (ref 0.0–0.9)

## 2019-06-30 LAB — HEP, RPR, HIV PANEL
HIV Screen 4th Generation wRfx: NONREACTIVE
Hepatitis B Surface Ag: NEGATIVE
RPR Ser Ql: NONREACTIVE

## 2019-06-30 MED ORDER — LIDOCAINE 5 % EX OINT
1.0000 | TOPICAL_OINTMENT | Freq: Four times a day (QID) | CUTANEOUS | 0 refills | Status: DC | PRN
Start: 2019-06-30 — End: 2019-07-14
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
## 2019-07-01 LAB — NUSWAB VAGINITIS PLUS (VG+)
Candida albicans, NAA: NEGATIVE
Candida glabrata, NAA: NEGATIVE
Chlamydia trachomatis, NAA: NEGATIVE
Neisseria gonorrhoeae, NAA: NEGATIVE
Trich vag by NAA: NEGATIVE

## 2019-07-01 LAB — URINALYSIS, MICROSCOPIC ONLY
Bacteria, UA: NONE SEEN
Casts: NONE SEEN /lpf
Epithelial Cells (non renal): NONE SEEN /hpf (ref 0–10)
RBC, Urine: NONE SEEN /hpf (ref 0–2)
WBC, UA: NONE SEEN /hpf (ref 0–5)

## 2019-07-02 LAB — URINE CULTURE

## 2019-07-11 ENCOUNTER — Encounter: Payer: Self-pay | Admitting: Obstetrics and Gynecology

## 2019-07-12 ENCOUNTER — Telehealth: Payer: Self-pay | Admitting: Obstetrics and Gynecology

## 2019-07-12 NOTE — Telephone Encounter (Signed)
Left message to call Noreene Larsson, RN at [facility] [phone].   6/15/21Nuswab and  6/16 UC neg

## 2019-07-12 NOTE — Telephone Encounter (Signed)
Reviewed with Dr. Oscar La.  Call returned to patient. Advised no additional recommendations.  PCP/Urgent Care for further evaluation of sore throat.  OK to keep AEX as scheduled, can re-evaluate symptoms at that time. Precautions reviewed.  Patient verbalizes understanding and is agreeable.   Encounter closed.

## 2019-07-12 NOTE — Telephone Encounter (Signed)
Patient is returning call.  °

## 2019-07-12 NOTE — Telephone Encounter (Signed)
Brittney Estrada, Brittney "[name]"  P [facility] If I can even wait until Wednesday... The pain has only gotten worse.

## 2019-07-12 NOTE — Telephone Encounter (Signed)
Spoke with patient. Patient was seen in the office on 6/15 and 6/16 for vulvar pain, Nuswab and urine culture negative. Patient states at that time she was unable to pin point where her pain was coming from. Patient states she is now able to determine her symptoms are from the bladder, "more consistent with an UTI". Patient reports urinary urgency, frequency and dysuria that has worsened through the weekend. Also reports sore throat. Denies hematuria, flank pain, vag d/c or odor, fever/chills, cough, runny nose, loss of taste/smell. Has been taking AZO over the weekend, this has helped reduce symptoms, symptoms have not resolved. Last took AZO on 6/27.   Patient is scheduled for AEX on 07/14/19. Advised patient to keep AEX as scheduled. Recommended f/u w/ PCP for further evaluation of sore throat, may repeat urine culture at that time if you choose. Patient states she does not have a PCP, urgent care recommended. Advised I will review with Dr. Oscar La and return call with any additional recommendations. Patient agreeable.   Routing to Dr. Oscar La

## 2019-07-14 ENCOUNTER — Other Ambulatory Visit: Payer: Self-pay

## 2019-07-14 ENCOUNTER — Ambulatory Visit (INDEPENDENT_AMBULATORY_CARE_PROVIDER_SITE_OTHER): Payer: BC Managed Care – PPO | Admitting: Obstetrics and Gynecology

## 2019-07-14 ENCOUNTER — Encounter: Payer: Self-pay | Admitting: Obstetrics and Gynecology

## 2019-07-14 VITALS — BP 118/74 | HR 79 | Temp 98.2°F | Ht 65.0 in | Wt 174.0 lb

## 2019-07-14 DIAGNOSIS — Z113 Encounter for screening for infections with a predominantly sexual mode of transmission: Secondary | ICD-10-CM | POA: Diagnosis not present

## 2019-07-14 DIAGNOSIS — Z0000 Encounter for general adult medical examination without abnormal findings: Secondary | ICD-10-CM | POA: Diagnosis not present

## 2019-07-14 DIAGNOSIS — Z01419 Encounter for gynecological examination (general) (routine) without abnormal findings: Secondary | ICD-10-CM

## 2019-07-14 DIAGNOSIS — Z124 Encounter for screening for malignant neoplasm of cervix: Secondary | ICD-10-CM | POA: Diagnosis not present

## 2019-07-14 NOTE — Progress Notes (Signed)
34 y.o. G0P0000 Single White or Caucasian Not Hispanic or Latino female here for annual exam.  Patient states that she called [facility] and was given Microbid and is feeling much better. She was also given Diflucan in case she got a yeast infection following the antibiotics.  She was seen in mid June and had a negative urine culture and negative vaginitis/cervical nuswab.  Menses q month x 4-5 days, she changes her menstrual cup every 12 hours. 1 day of bad cramps, takes a midol on occasion and it takes the edge off. Uses a heating pad.  Sexually active, no pain.    H/O severe migraines, some help with working from home, seeing a Insurance account manager.  Patient's last menstrual period was 06/22/2019.          Sexually active: Yes.    The current method of family planning is condoms all the time..    Exercising: Yes.    Walking daily  Smoker:  no  Health Maintenance: Pap: 03/01/19 WNL HR HPV Neg 07/13/18 Ascus-H HPV Neg  History of abnormal Pap:  Yes. H/O LEEP in 8/20, CIN I with + ectocervical margins. TDaP:  11/10/12 Gardasil: Complete   reports that she quit smoking about 5 years ago. Her smoking use included cigarettes. She has never used smokeless tobacco. She reports current alcohol use. She reports that she does not use drugs. She has 1-2 bottles of wine a week. She works in data entry.   Past Medical History:  Diagnosis Date  . Condyloma   . Environmental allergies   . Generalized anxiety disorder 05/05/2015   05/11/2015 PHQ9 = 3, GAD7 = 18   . Genital warts   . Migraine 05/05/2015  . Migraine without aura   . Scoliosis     Past Surgical History:  Procedure Laterality Date  . CONDYLOMA EXCISION/FULGURATION  2009    Current Outpatient Medications  Medication Sig Dispense Refill  . ELLA 30 MG tablet Take 1 tablet by mouth as needed.    . fluconazole (DIFLUCAN) 150 MG tablet Take 150 mg by mouth every 3 (three) days.    . naratriptan (AMERGE) 2.5 MG tablet   0  . nitrofurantoin,  macrocrystal-monohydrate, (MACROBID) 100 MG capsule     . tiZANidine (ZANAFLEX) 4 MG tablet Take 4 mg by mouth daily as needed.  1   No current facility-administered medications for this visit.    Family History  Problem Relation Age of Onset  . Alcohol abuse Mother   . Depression Mother   . Hypertension Mother   . Diabetes Maternal Grandmother   . Allergic rhinitis Neg Hx   . Angioedema Neg Hx   . Asthma Neg Hx   . Atopy Neg Hx   . Eczema Neg Hx   . Immunodeficiency Neg Hx   . Urticaria Neg Hx     Review of Systems  All other systems reviewed and are negative.   Exam:   BP 118/74   Pulse 79   Temp 98.2 F (36.8 C)   Ht 5\' 5"  (1.651 m)   Wt 174 lb (78.9 kg)   LMP 06/22/2019   SpO2 100%   BMI 28.96 kg/m   Weight change: @WEIGHTCHANGE @ Height:   Height: 5\' 5"  (165.1 cm)  Ht Readings from Last 3 Encounters:  07/14/19 5\' 5"  (1.651 m)  06/30/19 5\' 5"  (1.651 m)  06/29/19 5\' 5"  (1.651 m)    General appearance: alert, cooperative and appears stated age Head: Normocephalic, without obvious abnormality, atraumatic Neck:  no adenopathy, supple, symmetrical, trachea midline and thyroid normal to inspection and palpation Lungs: clear to auscultation bilaterally Cardiovascular: regular rate and rhythm Breasts: normal appearance, no masses or tenderness Abdomen: soft, non-tender; non distended,  no masses,  no organomegaly Extremities: extremities normal, atraumatic, no cyanosis or edema Skin: Skin color, texture, turgor normal. No rashes or lesions Lymph nodes: Cervical, supraclavicular, and axillary nodes normal. No abnormal inguinal nodes palpated Neurologic: Grossly normal   Pelvic: External genitalia:  no lesions              Urethra:  normal appearing urethra with no masses, tenderness or lesions              Bartholins and Skenes: normal                 Vagina: normal appearing vagina with normal color and discharge, no lesions              Cervix: no lesions, not  stenotic               Bimanual Exam:  Uterus:  normal size, contour, position, consistency, mobility, non-tender              Adnexa: no mass, fullness, tenderness               Rectovaginal: Confirms               Anus:  normal sphincter tone, no lesions  Carolynn Serve chaperoned for the exam.  A:  Well Woman with normal exam  H/O LEEP in 8/20, CIN I with +margins  Severe migraines, helped with working from home. Seeing a Neurologist.   P:   Pap with hpv  Had negative STD testing this month other than HSV testing, would like that today (understands limitations)  Screening labs  Condoms for contraception  Discussed breast self exam  Discussed calcium and vit D intake

## 2019-07-14 NOTE — Patient Instructions (Signed)
EXERCISE AND DIET:  We recommended that you start or continue a regular exercise program for good health. Regular exercise means any activity that makes your heart beat faster and makes you sweat.  We recommend exercising at least 30 minutes per day at least 3 days a week, preferably 4 or 5.  We also recommend a diet low in fat and sugar.  Inactivity, poor dietary choices and obesity can cause diabetes, heart attack, stroke, and kidney damage, among others.    ALCOHOL AND SMOKING:  Women should limit their alcohol intake to no more than 7 drinks/beers/glasses of wine (combined, not each!) per week. Moderation of alcohol intake to this level decreases your risk of breast cancer and liver damage. And of course, no recreational drugs are part of a healthy lifestyle.  And absolutely no smoking or even second hand smoke. Most people know smoking can cause heart and lung diseases, but did you know it also contributes to weakening of your bones? Aging of your skin?  Yellowing of your teeth and nails?  CALCIUM AND VITAMIN D:  Adequate intake of calcium and Vitamin D are recommended.  The recommendations for exact amounts of these supplements seem to change often, but generally speaking 1,000 mg of calcium (between diet and supplement) and 800 units of Vitamin D per day seems prudent. Certain women may benefit from higher intake of Vitamin D.  If you are among these women, your doctor will have told you during your visit.    PAP SMEARS:  Pap smears, to check for cervical cancer or precancers,  have traditionally been done yearly, although recent scientific advances have shown that most women can have pap smears less often.  However, every woman still should have a physical exam from her gynecologist every year. It will include a breast check, inspection of the vulva and vagina to check for abnormal growths or skin changes, a visual exam of the cervix, and then an exam to evaluate the size and shape of the uterus and  ovaries.  And after 34 years of age, a rectal exam is indicated to check for rectal cancers. We will also provide age appropriate advice regarding health maintenance, like when you should have certain vaccines, screening for sexually transmitted diseases, bone density testing, colonoscopy, mammograms, etc.   MAMMOGRAMS:  All women over 40 years old should have a yearly mammogram. Many facilities now offer a "3D" mammogram, which may cost around $50 extra out of pocket. If possible,  we recommend you accept the option to have the 3D mammogram performed.  It both reduces the number of women who will be called back for extra views which then turn out to be normal, and it is better than the routine mammogram at detecting truly abnormal areas.    COLON CANCER SCREENING: Now recommend starting at age 45. At this time colonoscopy is not covered for routine screening until 50. There are take home tests that can be done between 45-49.   COLONOSCOPY:  Colonoscopy to screen for colon cancer is recommended for all women at age 50.  We know, you hate the idea of the prep.  We agree, BUT, having colon cancer and not knowing it is worse!!  Colon cancer so often starts as a polyp that can be seen and removed at colonscopy, which can quite literally save your life!  And if your first colonoscopy is normal and you have no family history of colon cancer, most women don't have to have it again for   10 years.  Once every ten years, you can do something that may end up saving your life, right?  We will be happy to help you get it scheduled when you are ready.  Be sure to check your insurance coverage so you understand how much it will cost.  It may be covered as a preventative service at no cost, but you should check your particular policy.      Breast Self-Awareness Breast self-awareness means being familiar with how your breasts look and feel. It involves checking your breasts regularly and reporting any changes to your  health care provider. Practicing breast self-awareness is important. A change in your breasts can be a sign of a serious medical problem. Being familiar with how your breasts look and feel allows you to find any problems early, when treatment is more likely to be successful. All women should practice breast self-awareness, including women who have had breast implants. How to do a breast self-exam One way to learn what is normal for your breasts and whether your breasts are changing is to do a breast self-exam. To do a breast self-exam: Look for Changes  1. Remove all the clothing above your waist. 2. Stand in front of a mirror in a room with good lighting. 3. Put your hands on your hips. 4. Push your hands firmly downward. 5. Compare your breasts in the mirror. Look for differences between them (asymmetry), such as: ? Differences in shape. ? Differences in size. ? Puckers, dips, and bumps in one breast and not the other. 6. Look at each breast for changes in your skin, such as: ? Redness. ? Scaly areas. 7. Look for changes in your nipples, such as: ? Discharge. ? Bleeding. ? Dimpling. ? Redness. ? A change in position. Feel for Changes Carefully feel your breasts for lumps and changes. It is best to do this while lying on your back on the floor and again while sitting or standing in the shower or tub with soapy water on your skin. Feel each breast in the following way:  Place the arm on the side of the breast you are examining above your head.  Feel your breast with the other hand.  Start in the nipple area and make  inch (2 cm) overlapping circles to feel your breast. Use the pads of your three middle fingers to do this. Apply light pressure, then medium pressure, then firm pressure. The light pressure will allow you to feel the tissue closest to the skin. The medium pressure will allow you to feel the tissue that is a little deeper. The firm pressure will allow you to feel the tissue  close to the ribs.  Continue the overlapping circles, moving downward over the breast until you feel your ribs below your breast.  Move one finger-width toward the center of the body. Continue to use the  inch (2 cm) overlapping circles to feel your breast as you move slowly up toward your collarbone.  Continue the up and down exam using all three pressures until you reach your armpit.  Write Down What You Find  Write down what is normal for each breast and any changes that you find. Keep a written record with breast changes or normal findings for each breast. By writing this information down, you do not need to depend only on memory for size, tenderness, or location. Write down where you are in your menstrual cycle, if you are still menstruating. If you are having trouble noticing differences   in your breasts, do not get discouraged. With time you will become more familiar with the variations in your breasts and more comfortable with the exam. How often should I examine my breasts? Examine your breasts every month. If you are breastfeeding, the best time to examine your breasts is after a feeding or after using a breast pump. If you menstruate, the best time to examine your breasts is 5-7 days after your period is over. During your period, your breasts are lumpier, and it may be more difficult to notice changes. When should I see my health care provider? See your health care provider if you notice:  A change in shape or size of your breasts or nipples.  A change in the skin of your breast or nipples, such as a reddened or scaly area.  Unusual discharge from your nipples.  A lump or thick area that was not there before.  Pain in your breasts.  Anything that concerns you.  

## 2019-07-15 ENCOUNTER — Encounter: Payer: Self-pay | Admitting: Obstetrics and Gynecology

## 2019-07-15 ENCOUNTER — Other Ambulatory Visit (HOSPITAL_COMMUNITY)
Admission: RE | Admit: 2019-07-15 | Discharge: 2019-07-15 | Disposition: A | Discharge status: Home / Self Care | Payer: BC Managed Care – PPO | Source: Ambulatory Visit | Attending: Obstetrics and Gynecology | Admitting: Obstetrics and Gynecology

## 2019-07-15 ENCOUNTER — Telehealth: Payer: Self-pay

## 2019-07-15 DIAGNOSIS — Z124 Encounter for screening for malignant neoplasm of cervix: Secondary | ICD-10-CM | POA: Diagnosis present

## 2019-07-15 LAB — COMPREHENSIVE METABOLIC PANEL
ALT: 7 IU/L (ref 0–32)
AST: 15 IU/L (ref 0–40)
Albumin/Globulin Ratio: 1.6 (ref 1.2–2.2)
Albumin: 4.8 g/dL (ref 3.8–4.8)
Alkaline Phosphatase: 85 IU/L (ref 48–121)
BUN/Creatinine Ratio: 8 — ABNORMAL LOW (ref 9–23)
BUN: 8 mg/dL (ref 6–20)
Bilirubin Total: 0.3 mg/dL (ref 0.0–1.2)
CO2: 23 mmol/L (ref 20–29)
Calcium: 9.2 mg/dL (ref 8.7–10.2)
Chloride: 101 mmol/L (ref 96–106)
Creatinine, Ser: 0.95 mg/dL (ref 0.57–1.00)
GFR calc Af Amer: 90 mL/min/{1.73_m2} (ref >59)
GFR calc non Af Amer: 78 mL/min/{1.73_m2} (ref >59)
Globulin, Total: 3 g/dL (ref 1.5–4.5)
Glucose: 81 mg/dL (ref 65–99)
Potassium: 3.8 mmol/L (ref 3.5–5.2)
Sodium: 138 mmol/L (ref 134–144)
Total Protein: 7.8 g/dL (ref 6.0–8.5)

## 2019-07-15 LAB — LIPID PANEL
Chol/HDL Ratio: 3.8 ratio (ref 0.0–4.4)
Cholesterol, Total: 180 mg/dL (ref 100–199)
HDL: 48 mg/dL (ref >39)
LDL Chol Calc (NIH): 110 mg/dL — ABNORMAL HIGH (ref 0–99)
Triglycerides: 120 mg/dL (ref 0–149)
VLDL Cholesterol Cal: 22 mg/dL (ref 5–40)

## 2019-07-15 LAB — CBC
Hematocrit: 39.2 % (ref 34.0–46.6)
Hemoglobin: 13.2 g/dL (ref 11.1–15.9)
MCH: 29.7 pg (ref 26.6–33.0)
MCHC: 33.7 g/dL (ref 31.5–35.7)
MCV: 88 fL (ref 79–97)
Platelets: 351 10*3/uL (ref 150–450)
RBC: 4.44 x10E6/uL (ref 3.77–5.28)
RDW: 12 % (ref 11.7–15.4)
WBC: 5.8 10*3/uL (ref 3.4–10.8)

## 2019-07-15 LAB — HSV(HERPES SIMPLEX VRS) I + II AB-IGG
HSV 1 Glycoprotein G Ab, IgG: <0.91 index (ref 0.00–0.90)
HSV 2 IgG, Type Spec: <0.91 index (ref 0.00–0.90)

## 2019-07-15 NOTE — Telephone Encounter (Signed)
Routing to Dr. Oscar La to review 6/30 results.

## 2019-07-15 NOTE — Telephone Encounter (Signed)
Patient notified of results by MyChart message.   Message Seen by patient Brittney Estrada on 07/15/2019 12:55 PM  Encounter closed.

## 2019-07-15 NOTE — Telephone Encounter (Signed)
Brittney Estrada, Brittney "[name]"  [facility] just pinged my email with the results. You may not have even had a chance to look at them. Can we please have a quick phone call to go over them? I have a couple of questions.

## 2019-07-15 NOTE — Telephone Encounter (Signed)
Spoke with the patient, reviewed her negative hsv serology

## 2019-07-15 NOTE — Addendum Note (Signed)
Addended by: Tobi Bastos on: 07/15/2019 12:50 PM   Modules accepted: Orders

## 2019-07-15 NOTE — Telephone Encounter (Signed)
Trayonna, Bachmeier "[name]"  [facility] I already sent a message about this but I would really like to go over this test result on the phone please

## 2019-07-18 ENCOUNTER — Encounter: Payer: Self-pay | Admitting: Obstetrics and Gynecology

## 2019-07-18 LAB — CYTOLOGY - PAP
Comment: NEGATIVE
High risk HPV: POSITIVE — AB

## 2019-07-20 ENCOUNTER — Other Ambulatory Visit: Payer: Self-pay

## 2019-07-20 ENCOUNTER — Ambulatory Visit: Payer: BC Managed Care – PPO | Admitting: Obstetrics and Gynecology

## 2019-07-20 ENCOUNTER — Telehealth: Payer: Self-pay

## 2019-07-20 ENCOUNTER — Encounter: Payer: Self-pay | Admitting: Obstetrics and Gynecology

## 2019-07-20 ENCOUNTER — Telehealth: Payer: Self-pay | Admitting: Obstetrics and Gynecology

## 2019-07-20 ENCOUNTER — Other Ambulatory Visit (HOSPITAL_COMMUNITY)
Admission: RE | Admit: 2019-07-20 | Discharge: 2019-07-20 | Disposition: A | Discharge status: Home / Self Care | Payer: BC Managed Care – PPO | Source: Ambulatory Visit | Attending: Obstetrics and Gynecology | Admitting: Obstetrics and Gynecology

## 2019-07-20 VITALS — BP 128/72 | HR 99 | Temp 98.4°F | Ht 65.0 in | Wt 172.0 lb

## 2019-07-20 DIAGNOSIS — A630 Anogenital (venereal) warts: Secondary | ICD-10-CM | POA: Diagnosis not present

## 2019-07-20 DIAGNOSIS — R87612 Low grade squamous intraepithelial lesion on cytologic smear of cervix (LGSIL): Secondary | ICD-10-CM | POA: Diagnosis not present

## 2019-07-20 DIAGNOSIS — Z01812 Encounter for preprocedural laboratory examination: Secondary | ICD-10-CM

## 2019-07-20 DIAGNOSIS — Z87410 Personal history of cervical dysplasia: Secondary | ICD-10-CM | POA: Insufficient documentation

## 2019-07-20 DIAGNOSIS — R87610 Atypical squamous cells of undetermined significance on cytologic smear of cervix (ASC-US): Secondary | ICD-10-CM

## 2019-07-20 DIAGNOSIS — R87810 Cervical high risk human papillomavirus (HPV) DNA test positive: Secondary | ICD-10-CM

## 2019-07-20 LAB — POCT URINE PREGNANCY: Preg Test, Ur: NEGATIVE

## 2019-07-20 NOTE — Telephone Encounter (Signed)
Call placed to convey benefits. Spoke with the patient and conveyed the benefits. Patient understands/agreeable with the benefits. Patient is aware of the cancellation policy. Appointment scheduled 07/20/19.
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
## 2019-07-20 NOTE — Progress Notes (Signed)
Erroneous encounter. Closed.

## 2019-07-20 NOTE — Addendum Note (Signed)
Addended by: Isabell Jarvis on: 07/20/2019 11:41 AM   Modules accepted: Orders

## 2019-07-20 NOTE — Telephone Encounter (Signed)
Cc: Hayley for precert for colpo

## 2019-07-20 NOTE — Patient Instructions (Signed)

## 2019-07-20 NOTE — Telephone Encounter (Signed)
Otilia, Kareem "[name]"  [facility] Does < .91 read as "greater than" or "less then"? I see the equivocal is .91 - 1.09. I wasn't sure if <.91 meant I had less then that or greater than. Just trying to understand how you read it.   Loralye, Loberg "[name]"  [facility] Please call me ASAP to go over this.

## 2019-07-20 NOTE — Telephone Encounter (Signed)
Spoke with pt. Pt given results and recommendations per Dr Oscar La. Pt agreeable. Pt anxious about having colpo done as soon as possible.  Pt scheduled today 7/6 at 4 pm with Dr Oscar La for Colpo. Pt agreeable and verbalized understanding of date and time of appt CPS neg. Pt has also additional questions with results and will discuss at OV.   Routing to Dr Oscar La for review.  Encounter closed.    Message Received: Junius Creamer, Craig Guess, MD  Leda Min, RN Please let the patient know that her pap returned with LSIL, +HPV. She had a LEEP last year, then a normal pap in 2/21. She will need another colposcopy.

## 2019-07-22 LAB — SURGICAL PATHOLOGY

## 2019-07-23 ENCOUNTER — Telehealth: Payer: Self-pay

## 2019-07-23 DIAGNOSIS — R5383 Other fatigue: Secondary | ICD-10-CM

## 2019-07-23 DIAGNOSIS — Z8619 Personal history of other infectious and parasitic diseases: Secondary | ICD-10-CM

## 2019-07-23 NOTE — Telephone Encounter (Signed)
-----   Message from Romualdo Bolk, MD sent at 07/23/2019 10:24 AM EDT ----- Please let the patient know that her biopsy returned with CIN I, she needs another pap and hpv in one year. I did hear back from primary care and will place an order for some screening labs for an autoimmune disorder (CRP, SED, ANA ). She recommended that she find a primary she is comfortable with her to help guide her. She also gave this option:  [email or web address]  Virtual visits through the [facility].

## 2019-07-23 NOTE — Telephone Encounter (Signed)
Routing to Dr Oscar La for results and recommendations.

## 2019-07-23 NOTE — Telephone Encounter (Signed)
Patient called in regards to discuss labs. Patient also stated that she was told to callback for Dr. Oscar La.

## 2019-07-23 NOTE — Telephone Encounter (Signed)
Spoke with patient. Message from Dr.Jertson given. Patient verbalizes understanding. Patient would like referral to Dr.Koberlein for PCP. Would like information for the [facility] and Dr.Koberlein send to her MyChart. MyChart message sent with all information. Patient is aware Dr.Koberlein's office will call her directly to schedule her appointment. Lab appointment scheduled for 07/26/2019 at 3:45 pm. Patient is agreeable to appointment date and time. 08 recall entered. Annual exam scheduled for 07/20/2020 at 4 pm with Dr.Jertson.  Routing to provider and will close encounter.

## 2019-07-24 ENCOUNTER — Encounter: Payer: Self-pay | Admitting: Obstetrics and Gynecology

## 2019-07-26 ENCOUNTER — Encounter: Payer: Self-pay | Admitting: Obstetrics and Gynecology

## 2019-07-26 ENCOUNTER — Other Ambulatory Visit: Payer: Self-pay

## 2019-07-26 ENCOUNTER — Other Ambulatory Visit (HOSPITAL_COMMUNITY)
Admission: RE | Admit: 2019-07-26 | Discharge: 2019-07-26 | Disposition: A | Discharge status: Home / Self Care | Payer: BC Managed Care – PPO | Source: Ambulatory Visit | Attending: Obstetrics and Gynecology | Admitting: Obstetrics and Gynecology

## 2019-07-26 ENCOUNTER — Ambulatory Visit: Payer: BC Managed Care – PPO | Admitting: Obstetrics and Gynecology

## 2019-07-26 ENCOUNTER — Other Ambulatory Visit: Payer: BC Managed Care – PPO

## 2019-07-26 ENCOUNTER — Telehealth: Payer: Self-pay | Admitting: Obstetrics and Gynecology

## 2019-07-26 VITALS — BP 110/70 | HR 80 | Resp 10 | Ht 65.0 in | Wt 172.0 lb

## 2019-07-26 DIAGNOSIS — R5383 Other fatigue: Secondary | ICD-10-CM

## 2019-07-26 DIAGNOSIS — N9089 Other specified noninflammatory disorders of vulva and perineum: Secondary | ICD-10-CM

## 2019-07-26 DIAGNOSIS — Z8619 Personal history of other infectious and parasitic diseases: Secondary | ICD-10-CM

## 2019-07-26 NOTE — Patient Instructions (Signed)

## 2019-07-26 NOTE — Telephone Encounter (Signed)
Spoke with patient. Patient reports she has a new genital wart on left side of the vaginal opening, noticed a couple of days ago. Patient reports intermittent aching and pain at location of the wart. Patient denies any other GYN symptoms. Patient is requesting an OV with Dr. Oscar La to confirm this is a genital wart and possible removal. Patient has a hx of condyloma. She is scheduled for lab appt today at 3:45pm.   Advised patient I will have to review schedule with Dr. Oscar La and f/u. Advised patient Dr. Oscar La is in the OR, response may not be immediate. Patient agreeable.
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
## 2019-07-26 NOTE — Telephone Encounter (Signed)
Reviewed with Dr. Oscar La, call returned to patient. OV scheduled for today at 4:15pm. Lab appt cancelled. Patient verbalizes understanding and is agreeable.   Routing to provider for final review. Patient is agreeable to disposition. Will close encounter.

## 2019-07-26 NOTE — Telephone Encounter (Signed)
Brittney Estrada, Brittney Estrada "[name]"  P [facility] Is there any way I can be examined on Monday as well? I believe I have a new spot that needs to be excised. If not available on Monday, can we schedule something ASAP? Thank you.

## 2019-07-27 LAB — ANA: Anti Nuclear Antibody (ANA): NEGATIVE

## 2019-07-27 LAB — SEDIMENTATION RATE: Sed Rate: 6 mm/h (ref 0–32)

## 2019-07-27 LAB — TSH: TSH: 2.32 u[IU]/mL (ref 0.450–4.500)

## 2019-07-27 LAB — C-REACTIVE PROTEIN: CRP: 1 mg/L (ref 0–10)

## 2019-07-28 ENCOUNTER — Other Ambulatory Visit: Payer: Self-pay

## 2019-07-29 LAB — SURGICAL PATHOLOGY

## 2019-08-18 ENCOUNTER — Encounter: Payer: Self-pay | Admitting: Obstetrics and Gynecology

## 2019-08-18 ENCOUNTER — Telehealth: Payer: Self-pay

## 2019-08-18 NOTE — Telephone Encounter (Signed)
Spoke with pt. Pt sent mychart message to update Dr Oscar La. Pt states Dr Hassan Rowan no longer taking new pts at this time. Pt asking about where to go for PCP. Pt given options of [facility] [address] and [address]. Pt states thought about going to [facility] to see Tandy Gaw. PA. Advised pt to call and make appt to establish care. Pt agreeable and pt to send mychart message back when pt has appt to let Dr Oscar La know. Pt aware no referral needed for PCP.   Routing to Dr Oscar La for [facility] Encounter closed.

## 2019-08-18 NOTE — Telephone Encounter (Signed)
Preslyn, Warr "[name]"  P [facility] Hi,  I still hadn't heard from Dr. Hassan Rowan so I gave her office a call. She is not accepting new patients. Do you have any other recommendations for who I could see?   Thank you,  Lanora Manis

## 2019-12-29 ENCOUNTER — Telehealth: Payer: Self-pay

## 2019-12-29 NOTE — Telephone Encounter (Signed)
Patients office visit for STD testing o (01/17/20) was cancelled due to office being closed. Need triage to assist in rescheduling, no available appointments.

## 2019-12-29 NOTE — Telephone Encounter (Signed)
Spoke with pt. Pt states needing to reschedule STD testing since our office cancelled appt on 01/17/20. Pt states gets testing every 6 months due to not being consistent with same partner.  Pt scheduled for 12/22 at 1 pm with Dr Oscar La. Pt declines earlier offered  appts due to work schedule.  Encounter closed

## 2020-01-05 ENCOUNTER — Ambulatory Visit: Payer: BC Managed Care – PPO | Admitting: Obstetrics and Gynecology

## 2020-01-05 ENCOUNTER — Other Ambulatory Visit: Payer: Self-pay

## 2020-01-05 ENCOUNTER — Encounter: Payer: Self-pay | Admitting: Obstetrics and Gynecology

## 2020-01-05 VITALS — HR 85 | Ht 65.0 in | Wt 175.6 lb

## 2020-01-05 DIAGNOSIS — Z113 Encounter for screening for infections with a predominantly sexual mode of transmission: Secondary | ICD-10-CM | POA: Diagnosis not present
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
## 2020-01-06 LAB — HEP, RPR, HIV PANEL
HIV Screen 4th Generation wRfx: NONREACTIVE
Hepatitis B Surface Ag: NEGATIVE
RPR Ser Ql: NONREACTIVE

## 2020-01-06 LAB — HEPATITIS C ANTIBODY: Hep C Virus Ab: <0.1 s/co ratio (ref 0.0–0.9)

## 2020-01-07 LAB — CHLAMYDIA/GONOCOCCUS/TRICHOMONAS, NAA
Chlamydia by NAA: NEGATIVE
Gonococcus by NAA: NEGATIVE
Trich vag by NAA: NEGATIVE

## 2020-01-17 ENCOUNTER — Ambulatory Visit: Payer: BC Managed Care – PPO | Admitting: Obstetrics and Gynecology

## 2020-01-19 ENCOUNTER — Telehealth: Payer: Self-pay | Admitting: Obstetrics and Gynecology

## 2020-01-19 NOTE — Telephone Encounter (Signed)
mychart message sent

## 2020-03-20 ENCOUNTER — Encounter: Payer: Self-pay | Admitting: Obstetrics and Gynecology

## 2020-07-20 ENCOUNTER — Ambulatory Visit: Payer: BC Managed Care – PPO | Admitting: Obstetrics and Gynecology
